# Patient Record
Sex: Female | Born: 1990 | Race: Black or African American | Hispanic: No | Marital: Married | Smoking: Never smoker
Health system: Southern US, Community
[De-identification: ages and names within clinical notes are randomized; demographics above are authoritative.]

## PROBLEM LIST (undated history)

## (undated) DIAGNOSIS — E2839 Other primary ovarian failure: Secondary | ICD-10-CM

## (undated) DIAGNOSIS — N870 Mild cervical dysplasia: Secondary | ICD-10-CM

## (undated) HISTORY — DX: Other primary ovarian failure: E28.39

## (undated) HISTORY — PX: NO PAST SURGERIES: SHX2092

## (undated) HISTORY — DX: Mild cervical dysplasia: N87.0

---

## 2016-04-30 ENCOUNTER — Encounter: Payer: Self-pay | Admitting: Emergency Medicine

## 2016-04-30 ENCOUNTER — Ambulatory Visit
Admission: EM | Admit: 2016-04-30 | Discharge: 2016-04-30 | Disposition: A | Discharge status: Home / Self Care | Payer: Managed Care, Other (non HMO) | Attending: Family Medicine | Admitting: Family Medicine

## 2016-04-30 DIAGNOSIS — Z3202 Encounter for pregnancy test, result negative: Secondary | ICD-10-CM | POA: Diagnosis not present

## 2016-04-30 LAB — HCG, QUANTITATIVE, PREGNANCY: hCG, Beta Chain, Quant, S: 2 m[IU]/mL (ref <5)

## 2016-04-30 LAB — PREGNANCY, URINE: PREG TEST UR: NEGATIVE

## 2016-04-30 NOTE — ED Triage Notes (Signed)
Patient states that she has missed her period by two weeks.  Patient is sexually active and does not use protection. Patient reports some nausea.

## 2016-04-30 NOTE — ED Provider Notes (Signed)
[facility]    CSN: [id number] Arrival date & time: 04/30/16  4098  First Provider Contact:  None       History   Chief Complaint Chief Complaint  Patient presents with  . Possible Pregnancy    HPI Candid Bovey is a 25 y.o. female.   Patient reports not having this pain for at least 2 weeks and she is of course worried that she might be pregnant. No past medical problems. No previous surgeries or operations. No chronic medical problems no pertinent family medical history relevant to today's visit no known drug allergies. She does not smoke. She uses condoms for birth control she is sexually active but admits that she does not use the condoms 100% time. No known drug allergies. She is gravida 0 para 0 AB .   The history is provided by the patient. No language interpreter was used.  Possible Pregnancy  This is a new problem. The current episode started more than 1 week ago. The problem occurs constantly. The problem has not changed since onset.Pertinent negatives include no chest pain, no abdominal pain, no headaches and no shortness of breath. Nothing aggravates the symptoms. Nothing relieves the symptoms. She has tried nothing for the symptoms. The treatment provided no relief.    History reviewed. No pertinent past medical history.  There are no active problems to display for this patient.   History reviewed. No pertinent surgical history.  OB History    No data available       Home Medications    Prior to Admission medications   Not on File    Family History History reviewed. No pertinent family history.  Social History Social History  Substance Use Topics  . Smoking status: Never Smoker  . Smokeless tobacco: Never Used  . Alcohol use Yes     Allergies   Review of patient's allergies indicates no known allergies.   Review of Systems Review of Systems  Respiratory: Negative for shortness of breath.   Cardiovascular: Negative for chest  pain.  Gastrointestinal: Negative for abdominal pain.  Genitourinary: Positive for menstrual problem.  Neurological: Negative for headaches.  All other systems reviewed and are negative.    Physical Exam Triage Vital Signs ED Triage Vitals  Enc Vitals Group     BP 04/30/16 1902 114/76     Pulse Rate 04/30/16 1902 69     Resp 04/30/16 1902 16     Temp 04/30/16 1902 98 F (36.7 C)     Temp Source 04/30/16 1902 Tympanic     SpO2 04/30/16 1902 100 %     Weight 04/30/16 1903 114 lb (51.7 kg)     Height 04/30/16 1903 5\' 3"  (1.6 m)     Head Circumference --      Peak Flow --      Pain Score 04/30/16 1905 0     Pain Loc --      Pain Edu? --      Excl. in GC? --    No data found.   Updated Vital Signs BP 114/76 (BP Location: Right Arm)   Pulse 69   Temp 98 F (36.7 C) (Tympanic)   Resp 16   Ht 5\' 3"  (1.6 m)   Wt 114 lb (51.7 kg)   LMP 03/18/2016 (Approximate)   SpO2 100%   BMI 20.19 kg/m   Visual Acuity Right Eye Distance:   Left Eye Distance:   Bilateral Distance:    Right Eye Near:  Left Eye Near:    Bilateral Near:     Physical Exam  Constitutional: She is oriented to person, place, and time. She appears well-developed and well-nourished.  HENT:  Head: Normocephalic and atraumatic.  Eyes: Pupils are equal, round, and reactive to light.  Neck: Normal range of motion.  Pulmonary/Chest: Effort normal.  Musculoskeletal: Normal range of motion.  Neurological: She is alert and oriented to person, place, and time.  Skin: Skin is warm and dry.  Psychiatric: She has a normal mood and affect.  Vitals reviewed.    UC Treatments / Results  Labs (all labs ordered are listed, but only abnormal results are displayed) Labs Reviewed  PREGNANCY, URINE  HCG, QUANTITATIVE, PREGNANCY    EKG  EKG Interpretation None       Radiology No results found.  Procedures Procedures (including critical care time)  Medications Ordered in UC Medications - No data to  display   Initial Impression / Assessment and Plan / UC Course  I have reviewed the triage vital signs and the nursing notes.  Pertinent labs & imaging results that were available during my care of the patient were reviewed by me and considered in my medical decision making (see chart for details).  Results for orders placed or performed during the hospital encounter of 04/30/16  Pregnancy, urine  Result Value Ref Range   Preg Test, Ur NEGATIVE NEGATIVE  hCG, quantitative, pregnancy  Result Value Ref Range   hCG, Beta Chain, Quant, S 2 <5 mIU/mL    Clinical Course     Patient informed things test was negative. Strongly suggest she use birth control are more effective and regular basis. Will give a work note for [employer].   Final Clinical Impressions(s) / UC Diagnoses     Final diagnoses:  Encounter for pregnancy test with result negative    New Prescriptions   Hassan RowanEugene Ilah Boule, MD 04/30/16 2104

## 2016-05-03 ENCOUNTER — Emergency Department: Payer: Managed Care, Other (non HMO)

## 2016-05-03 ENCOUNTER — Emergency Department
Admission: EM | Admit: 2016-05-03 | Discharge: 2016-05-03 | Disposition: A | Discharge status: Home / Self Care | Payer: Managed Care, Other (non HMO) | Attending: Student in an Organized Health Care Education/Training Program | Admitting: Student in an Organized Health Care Education/Training Program

## 2016-05-03 ENCOUNTER — Encounter: Payer: Self-pay | Admitting: *Deleted

## 2016-05-03 DIAGNOSIS — R112 Nausea with vomiting, unspecified: Secondary | ICD-10-CM | POA: Insufficient documentation

## 2016-05-03 DIAGNOSIS — R109 Unspecified abdominal pain: Secondary | ICD-10-CM

## 2016-05-03 DIAGNOSIS — Z5181 Encounter for therapeutic drug level monitoring: Secondary | ICD-10-CM | POA: Insufficient documentation

## 2016-05-03 DIAGNOSIS — R1032 Left lower quadrant pain: Secondary | ICD-10-CM | POA: Diagnosis not present

## 2016-05-03 DIAGNOSIS — R45851 Suicidal ideations: Secondary | ICD-10-CM | POA: Diagnosis not present

## 2016-05-03 DIAGNOSIS — R1084 Generalized abdominal pain: Secondary | ICD-10-CM | POA: Diagnosis present

## 2016-05-03 LAB — CBC
HEMATOCRIT: 43.3 % (ref 35.0–47.0)
HEMOGLOBIN: 14.9 g/dL (ref 12.0–16.0)
MCH: 30.4 pg (ref 26.0–34.0)
MCHC: 34.4 g/dL (ref 32.0–36.0)
MCV: 88.4 fL (ref 80.0–100.0)
Platelets: 339 10*3/uL (ref 150–440)
RBC: 4.89 MIL/uL (ref 3.80–5.20)
RDW: 12.5 % (ref 11.5–14.5)
WBC: 7.1 10*3/uL (ref 3.6–11.0)

## 2016-05-03 LAB — CHLAMYDIA/NGC RT PCR (ARMC ONLY)
CHLAMYDIA TR: NOT DETECTED
N GONORRHOEAE: NOT DETECTED

## 2016-05-03 LAB — URINALYSIS COMPLETE WITH MICROSCOPIC (ARMC ONLY)
Bacteria, UA: NONE SEEN
Bilirubin Urine: NEGATIVE
GLUCOSE, UA: NEGATIVE mg/dL
HGB URINE DIPSTICK: NEGATIVE
Ketones, ur: NEGATIVE mg/dL
LEUKOCYTES UA: NEGATIVE
Nitrite: NEGATIVE
Protein, ur: NEGATIVE mg/dL
SPECIFIC GRAVITY, URINE: 1.018 (ref 1.005–1.030)
pH: 7 (ref 5.0–8.0)

## 2016-05-03 LAB — COMPREHENSIVE METABOLIC PANEL
ALT: 17 U/L (ref 14–54)
ANION GAP: 14 (ref 5–15)
AST: 26 U/L (ref 15–41)
Albumin: 5.3 g/dL — ABNORMAL HIGH (ref 3.5–5.0)
Alkaline Phosphatase: 44 U/L (ref 38–126)
BILIRUBIN TOTAL: 0.6 mg/dL (ref 0.3–1.2)
BUN: 11 mg/dL (ref 6–20)
CO2: 21 mmol/L — ABNORMAL LOW (ref 22–32)
Calcium: 10.2 mg/dL (ref 8.9–10.3)
Chloride: 106 mmol/L (ref 101–111)
Creatinine, Ser: 0.55 mg/dL (ref 0.44–1.00)
GFR calc Af Amer: >60 mL/min (ref >60)
Glucose, Bld: 87 mg/dL (ref 65–99)
POTASSIUM: 3.9 mmol/L (ref 3.5–5.1)
Sodium: 141 mmol/L (ref 135–145)
TOTAL PROTEIN: 8.6 g/dL — AB (ref 6.5–8.1)

## 2016-05-03 LAB — URINE DRUG SCREEN, QUALITATIVE (ARMC ONLY)
AMPHETAMINES, UR SCREEN: NOT DETECTED
BENZODIAZEPINE, UR SCRN: NOT DETECTED
Barbiturates, Ur Screen: NOT DETECTED
COCAINE METABOLITE, UR ~~LOC~~: NOT DETECTED
Cannabinoid 50 Ng, Ur ~~LOC~~: NOT DETECTED
MDMA (ECSTASY) UR SCREEN: NOT DETECTED
METHADONE SCREEN, URINE: NOT DETECTED
OPIATE, UR SCREEN: NOT DETECTED
Phencyclidine (PCP) Ur S: NOT DETECTED
Tricyclic, Ur Screen: NOT DETECTED

## 2016-05-03 LAB — WET PREP, GENITAL
Clue Cells Wet Prep HPF POC: NONE SEEN
SPERM: NONE SEEN
Trich, Wet Prep: NONE SEEN
Yeast Wet Prep HPF POC: NONE SEEN

## 2016-05-03 LAB — POCT PREGNANCY, URINE: PREG TEST UR: NEGATIVE

## 2016-05-03 MED ORDER — PROMETHAZINE HCL 25 MG PO TABS
12.5000 mg | ORAL_TABLET | Freq: Once | ORAL | Status: AC
Start: 2016-05-03 — End: 2016-05-03
  Administered 2016-05-03: 12.5 mg via ORAL
  Filled 2016-05-03: qty 1

## 2016-05-03 MED ORDER — PROMETHAZINE HCL 12.5 MG PO TABS: 12.5000 mg | ORAL_TABLET | Freq: Four times a day (QID) | ORAL | 0 refills | Status: DC | PRN

## 2016-05-03 MED ORDER — OXYCODONE HCL 5 MG PO TABS
5.0000 mg | ORAL_TABLET | Freq: Once | ORAL | Status: AC
  Administered 2016-05-03: 5 mg via ORAL
  Filled 2016-05-03: qty 1

## 2016-05-03 MED ORDER — DICYCLOMINE HCL 10 MG PO CAPS
10.0000 mg | ORAL_CAPSULE | Freq: Once | ORAL | Status: AC
  Administered 2016-05-03: 10 mg via ORAL
  Filled 2016-05-03: qty 1

## 2016-05-03 MED ORDER — DICYCLOMINE HCL 10 MG PO CAPS: 10.0000 mg | ORAL_CAPSULE | Freq: Three times a day (TID) | ORAL | 0 refills | Status: DC

## 2016-05-03 NOTE — ED Notes (Signed)
Pt in ultrasound vis stretcher

## 2016-05-03 NOTE — ED Notes (Signed)
Patient states she doesn't want to be seen for suicidal ideation and plan, but just for her abdominal pain. Patient states she misheard  the 3 times this Clinical research associatewriter asked her if she had a plan and when it affirmed that she did if she could detail the plan which she declined to do. Patient did agree to be seen and dress out for the behavioral quad.

## 2016-05-03 NOTE — ED Notes (Addendum)
Pt states abdominal pain, N&V, migraines x few weeks. Pt states last emesis was this AM. Pt states she took a urine and blood pregnancy test that were both negative. Pt states LMP July 17th. Pt states abdominal pain central lower.

## 2016-05-03 NOTE — ED Provider Notes (Signed)
[facility] Emergency Department Provider Note    First MD Initiated Contact with Patient 05/03/16 1516     (approximate)  I have reviewed the triage vital signs and the nursing notes.   HISTORY  Chief Complaint Abdominal Pain and Suicidal    HPI Kim Gilbert is a 25 y.o. female presents with 2 weeks of diffuse abdominal pain. Patient states that she's had several days of nausea and vomiting. Denies any fevers. No recent shortness of breath. Denies any burning with urination or flank pain. Has not had any previous abdominal surgeries. [facility] and was [facility] here for further evaluation.States her last cycle was 4 weeks ago. She is sexually active and does not use barrier protection. She's not on any oral contraceptive pills.  Of note during the triage process the patient stated that she had been having passive suicidal ideation due to not being able to find a job after graduating college. On questioning in the ER the patient denies any active suicidal ideation. She does not have a plan. Is not currently on any psychiatric medications. States that she will have passive suicidal ideation when she gets stressed out because she can't find a job. States that she does have good support at home. Does not have any thoughts of harming herself or others at this time. No auditory or visual hallucinations.   History reviewed. No pertinent past medical history.  There are no active problems to display for this patient.   History reviewed. No pertinent surgical history.  Prior to Admission medications   Not on File    Allergies Review of patient's allergies indicates no known allergies.  No family history on file.  Social History Social History  Substance Use Topics  . Smoking status: Never Smoker  . Smokeless tobacco: Never Used  . Alcohol use Yes     Comment: ocasionally    Review of Systems Patient denies headaches, rhinorrhea,  blurry vision, numbness, shortness of breath, chest pain, edema, cough, abdominal pain, nausea, vomiting, diarrhea, dysuria, fevers, rashes or hallucinations unless otherwise stated above in HPI. ____________________________________________   PHYSICAL EXAM:  VITAL SIGNS: Vitals:   05/03/16 1409  BP: 115/80  Pulse: 98  Resp: 18  Temp: 98.3 F (36.8 C)    Constitutional: Alert and oriented. Well appearing and in no acute distress. Eyes: Conjunctivae are normal. PERRL. EOMI. Head: Atraumatic. Nose: No congestion/rhinnorhea. Mouth/Throat: Mucous membranes are moist.  Oropharynx non-erythematous. Neck: No stridor. Painless ROM. No cervical spine tenderness to palpation Hematological/Lymphatic/Immunilogical: No cervical lymphadenopathy. Cardiovascular: Normal rate, regular rhythm. Grossly normal heart sounds.  Good peripheral circulation. Respiratory: Normal respiratory effort.  No retractions. Lungs CTAB. Gastrointestinal: Soft and non-tender, mild llq ttp, no rebound or guarding. No distention. No abdominal bruits. No CVA tenderness. Genitourinary: Normal appearing vaginal mucosa, no CMT, mild ttp in left adnexa Musculoskeletal: No lower extremity tenderness nor edema.  No joint effusions. Neurologic:  Normal speech and language. No gross focal neurologic deficits are appreciated. No gait instability. Skin:  Skin is warm, dry and intact. No rash noted. Psychiatric: Mood and affect are normal. Speech and behavior are normal.  ____________________________________________   LABS (all labs ordered are listed, but only abnormal results are displayed)  Results for orders placed or performed during the hospital encounter of 05/03/16 (from the past 24 hour(s))  cbc     Status: None   Collection Time: 05/03/16  2:25 PM  Result Value Ref Range   WBC 7.1 3.6 -  11.0 K/uL   RBC 4.89 3.80 - 5.20 MIL/uL   Hemoglobin 14.9 12.0 - 16.0 g/dL   HCT 16.1 09.6 - 04.5 %   MCV 88.4 80.0 - 100.0 fL     MCH 30.4 26.0 - 34.0 pg   MCHC 34.4 32.0 - 36.0 g/dL   RDW 40.9 81.1 - 91.4 %   Platelets 339 150 - 440 K/uL  Urine Drug Screen, Qualitative     Status: None   Collection Time: 05/03/16  2:25 PM  Result Value Ref Range   Tricyclic, Ur Screen NONE DETECTED NONE DETECTED   Amphetamines, Ur Screen NONE DETECTED NONE DETECTED   MDMA (Ecstasy)Ur Screen NONE DETECTED NONE DETECTED   Cocaine Metabolite,Ur [place] NONE DETECTED NONE DETECTED   Opiate, Ur Screen NONE DETECTED NONE DETECTED   Phencyclidine (PCP) Ur S NONE DETECTED NONE DETECTED   Cannabinoid 50 Ng, Ur [place] NONE DETECTED NONE DETECTED   Barbiturates, Ur Screen NONE DETECTED NONE DETECTED   Benzodiazepine, Ur Scrn NONE DETECTED NONE DETECTED   Methadone Scn, Ur NONE DETECTED NONE DETECTED  Urinalysis complete, with microscopic ([facility] only)     Status: Abnormal   Collection Time: 05/03/16  2:25 PM  Result Value Ref Range   Color, Urine YELLOW (A) YELLOW   APPearance CLOUDY (A) CLEAR   Glucose, UA NEGATIVE NEGATIVE mg/dL   Bilirubin Urine NEGATIVE NEGATIVE   Ketones, ur NEGATIVE NEGATIVE mg/dL   Specific Gravity, Urine 1.018 1.005 - 1.030   Hgb urine dipstick NEGATIVE NEGATIVE   pH 7.0 5.0 - 8.0   Protein, ur NEGATIVE NEGATIVE mg/dL   Nitrite NEGATIVE NEGATIVE   Leukocytes, UA NEGATIVE NEGATIVE   RBC / HPF 0-5 0 - 5 RBC/hpf   WBC, UA 0-5 0 - 5 WBC/hpf   Bacteria, UA NONE SEEN NONE SEEN   Squamous Epithelial / LPF 0-5 (A) NONE SEEN   Mucous PRESENT    Amorphous [place] PRESENT   Pregnancy, urine POC     Status: None   Collection Time: 05/03/16  2:31 PM  Result Value Ref Range   Preg Test, Ur NEGATIVE NEGATIVE   ____________________________________________  ____________________________________________  RADIOLOGY  TVUS IMPRESSION: Normal pelvic ultrasound. ____________________________________________   PROCEDURES  Procedure(s) performed: none    Critical Care performed:  no ____________________________________________   INITIAL IMPRESSION / ASSESSMENT AND PLAN / ED COURSE  Pertinent labs & imaging results that were available during my care of the patient were reviewed by me and considered in my medical decision making (see chart for details).  DDX: UTI, ectopic, pregnancy, torsion, TOA, PID, appendicitis, gastritis  Kim Gilbert is a 25 y.o. who presents to the ED with 2 weeks of lower abdominal pain and 1 day of nausea and vomiting. Patient is concerned that she is pregnant. Patient arrives afebrile and hemodynamically stable. No guarding or rebound on exam. There was report of her being passively suicidal in triage therefore she was initially brought over to psychiatric holding but on my evaluation she denies any suicidal ideation and appears stable from that standpoint. At this point we'll proceed with further evaluation of her abdominal pain. She is afebrile and the duration of symptoms have been ongoing for 2 weeks have a lower suspicion for acute appendicitis. Do not feel CT imaging clinically indicated at this time. The patient will be placed on continuous pulse oximetry and telemetry for monitoring.  Laboratory evaluation will be sent to evaluate for the above complaints.     Clinical Course  Comment By  Time  Urine pregnancy is negative. Willy EddyPatrick Anthem Frazer, MD 09/01 1551  Pelvic exam with mild tenderness to palpation in the left adnexa. No masses no guarding. Based on presentation will order transvaginal ultrasound to assess for cyst versus abscess. Willy EddyPatrick Jerlene Rockers, MD 09/01 1603  Repeat abdominal abdominal exam is soft and benign. Ultrasound shows no evidence of torsion or cyst. Presentation is not consistent with PID. Based on her well-appearing presentation do feel that she is appropriate for symptomatically management and have instructed patient to return to the ER for repeat abdominal exam in 12 hours if symptoms not improved.  Patient was able to  tolerate PO and was able to ambulate with a steady gait.  Willy EddyPatrick Serai Tukes, MD 09/01 1807   Have discussed with the patient and available family all diagnostics and treatments performed thus far and all questions were answered to the best of my ability. The patient demonstrates understanding and agreement with plan.   ____________________________________________   FINAL CLINICAL IMPRESSION(S) / ED DIAGNOSES  Final diagnoses:  Abdominal pain  Left lower quadrant pain      NEW MEDICATIONS STARTED DURING THIS VISIT:  New Prescriptions   No medications on file     Note:  This document was prepared using Dragon voice recognition software and may include unintentional dictation errors.    Willy EddyPatrick Nakema Fake, MD 05/03/16 [id number]

## 2016-05-03 NOTE — ED Notes (Signed)
Pt returned from U/S via stretcher. 

## 2016-05-03 NOTE — ED Triage Notes (Addendum)
Patient c/o sharp lower abdominal pain that has been present for two weeks. Patient c/o nausea and vomiting x5 today. [facility] and advised to come here. Patient was seen at a clinic 3 days ago and a HCG blood and urine pregnancy tests were done which came back negative. Patient states her last period was 7/17 and last for two days.

## 2016-05-03 NOTE — ED Triage Notes (Signed)
At the end of the triage assessment, patient state she has suicidal ideation and a plan.

## 2016-05-23 DIAGNOSIS — E2839 Other primary ovarian failure: Secondary | ICD-10-CM | POA: Insufficient documentation

## 2017-01-28 ENCOUNTER — Ambulatory Visit
Admission: EM | Admit: 2017-01-28 | Discharge: 2017-01-28 | Disposition: A | Discharge status: Home / Self Care | Payer: Managed Care, Other (non HMO) | Attending: Family Medicine | Admitting: Family Medicine

## 2017-01-28 ENCOUNTER — Encounter: Payer: Self-pay | Admitting: Gynecology

## 2017-01-28 DIAGNOSIS — N390 Urinary tract infection, site not specified: Secondary | ICD-10-CM

## 2017-01-28 LAB — URINALYSIS, COMPLETE (UACMP) WITH MICROSCOPIC
BILIRUBIN URINE: NEGATIVE
Glucose, UA: NEGATIVE mg/dL
Ketones, ur: NEGATIVE mg/dL
NITRITE: POSITIVE — AB
PROTEIN: 100 mg/dL — AB
Specific Gravity, Urine: 1.025 (ref 1.005–1.030)
pH: 7 (ref 5.0–8.0)

## 2017-01-28 MED ORDER — CEPHALEXIN 500 MG PO CAPS: 500.0000 mg | ORAL_CAPSULE | Freq: Two times a day (BID) | ORAL | 0 refills | Status: DC

## 2017-01-28 MED ORDER — PHENAZOPYRIDINE HCL 200 MG PO TABS: 200.0000 mg | ORAL_TABLET | Freq: Three times a day (TID) | ORAL | 0 refills | Status: DC

## 2017-01-28 NOTE — ED Triage Notes (Signed)
Patient c/o painful urination x today.

## 2017-01-28 NOTE — ED Provider Notes (Signed)
CSN: [id number]     Arrival date & time 01/28/17  1704 History   None    Chief Complaint  Patient presents with  . Urinary Tract Infection   (Consider location/radiation/quality/duration/timing/severity/associated sxs/prior Treatment) HPI  This a 26 year old female who presents with painful urination for 1 day. Urinary tract infections in the past and this is very similar. He complains of dysuria frequency urgency. She has no vaginal discharge. She has no nausea vomiting and has had no fever.       History reviewed. No pertinent past medical history. History reviewed. No pertinent surgical history. No family history on file. Social History  Substance Use Topics  . Smoking status: Never Smoker  . Smokeless tobacco: Never Used  . Alcohol use Yes     Comment: ocasionally   OB History    Gravida Para Term Preterm AB Living   0 0 0 0 0 0   SAB TAB Ectopic Multiple Live Births   0 0 0 0 0     Review of Systems  Constitutional: Negative for activity change, appetite change, chills, fatigue and fever.  Genitourinary: Positive for dysuria, frequency and urgency. Negative for vaginal discharge and vaginal pain.  All other systems reviewed and are negative.   Allergies  Patient has no known allergies.  Home Medications   Prior to Admission medications   Medication Sig Start Date End Date Taking? Authorizing Provider  estrogens, conjugated, (PREMARIN) 0.3 MG tablet Take 0.3 mg by mouth daily. Take daily for 21 days then do not take for 7 days.   Yes [provider]  progesterone (PROMETRIUM) 100 MG capsule Take 100 mg by mouth daily.   Yes [provider]  cephALEXin (KEFLEX) 500 MG capsule Take 1 capsule (500 mg total) by mouth 2 (two) times daily. 01/28/17   Lutricia Feiloemer, Ameli Sangiovanni P, PA-C  dicyclomine (BENTYL) 10 MG capsule Take 1 capsule (10 mg total) by mouth 4 (four) times daily -  before meals and at bedtime. 05/03/16 05/07/16  Willy Eddyobinson, Patrick, MD  phenazopyridine  (PYRIDIUM) 200 MG tablet Take 1 tablet (200 mg total) by mouth 3 (three) times daily. 01/28/17   Lutricia Feiloemer, Deklynn Charlet P, PA-C  promethazine (PHENERGAN) 12.5 MG tablet Take 1 tablet (12.5 mg total) by mouth every 6 (six) hours as needed for nausea or vomiting. 05/03/16   Willy Eddyobinson, Patrick, MD   Meds Ordered and Administered this Visit  Medications - No data to display  BP 110/80 (BP Location: Left Arm)   Pulse 76   Temp 98.2 F (36.8 C) (Oral)   Resp 16   Ht 5\' 2"  (1.575 m)   Wt 114 lb (51.7 kg)   LMP 01/21/2017   SpO2 100%   BMI 20.85 kg/m  No data found.   Physical Exam  Constitutional: She is oriented to person, place, and time. She appears well-developed and well-nourished. No distress.  HENT:  Head: Normocephalic.  Eyes: Pupils are equal, round, and reactive to light.  Neck: Normal range of motion.  Pulmonary/Chest: Effort normal and breath sounds normal. No respiratory distress. She has no wheezes. She has no rales.  Abdominal: Soft. Bowel sounds are normal. She exhibits no distension and no mass. There is tenderness. There is no rebound and no guarding.  She has mild right lower quadrant discomfort.  Musculoskeletal: Normal range of motion.  Neurological: She is alert and oriented to person, place, and time.  Skin: Skin is warm and dry.  Psychiatric: She has a normal mood and affect.  Her behavior is normal. Judgment and thought content normal.  Nursing note and vitals reviewed.   Urgent Care Course     Procedures (including critical care time)  Labs Review Labs Reviewed  URINALYSIS, COMPLETE (UACMP) WITH MICROSCOPIC - Abnormal; Notable for the following:       Result Value   APPearance CLOUDY (*)    Hgb urine dipstick LARGE (*)    Protein, ur 100 (*)    Nitrite POSITIVE (*)    Leukocytes, UA SMALL (*)    Squamous Epithelial / LPF 0-5 (*)    Bacteria, UA MANY (*)    All other components within normal limits  URINE CULTURE    Imaging Review No results  found.   Visual Acuity Review  Right Eye Distance:   Left Eye Distance:   Bilateral Distance:    Right Eye Near:   Left Eye Near:    Bilateral Near:         MDM   1. Lower urinary tract infectious disease    Discharge Medication List as of 01/28/2017  8:12 PM    START taking these medications   Details  cephALEXin (KEFLEX) 500 MG capsule Take 1 capsule (500 mg total) by mouth 2 (two) times daily., Starting Tue 01/28/2017, Normal    phenazopyridine (PYRIDIUM) 200 MG tablet Take 1 tablet (200 mg total) by mouth 3 (three) times daily., Starting Tue 01/28/2017, Normal      Plan: 1. Test/x-ray results and diagnosis reviewed with patient 2. rx as per orders; risks, benefits, potential side effects reviewed with patient 3. Recommend supportive treatment with Increase fluids. Send him regarding Pyridium turning her urine orange. Cultures and sensitivities of the urine will be available in 48 hours. 4. F/u prn if symptoms worsen or don't improve     Lutricia Feil, PA-C 01/28/17 2034

## 2017-01-31 LAB — URINE CULTURE: Culture: >=100000 — AB

## 2017-06-10 DIAGNOSIS — K625 Hemorrhage of anus and rectum: Secondary | ICD-10-CM | POA: Insufficient documentation

## 2017-07-11 ENCOUNTER — Ambulatory Visit
Admission: EM | Admit: 2017-07-11 | Discharge: 2017-07-11 | Disposition: A | Discharge status: Home / Self Care | Payer: Managed Care, Other (non HMO) | Attending: Family Medicine | Admitting: Family Medicine

## 2017-07-11 ENCOUNTER — Encounter: Payer: Self-pay | Admitting: *Deleted

## 2017-07-11 DIAGNOSIS — L259 Unspecified contact dermatitis, unspecified cause: Secondary | ICD-10-CM | POA: Diagnosis not present

## 2017-07-11 DIAGNOSIS — R21 Rash and other nonspecific skin eruption: Secondary | ICD-10-CM | POA: Diagnosis not present

## 2017-07-11 MED ORDER — TRIAMCINOLONE ACETONIDE 0.1 % EX OINT: 1.0000 "application " | TOPICAL_OINTMENT | Freq: Two times a day (BID) | CUTANEOUS | 0 refills | Status: DC

## 2017-07-11 NOTE — ED Triage Notes (Signed)
Rash to left neck x3 days.

## 2017-07-11 NOTE — ED Provider Notes (Signed)
[facility]    CSN: [id number] Arrival date & time: 07/11/17  1529     History   Chief Complaint Chief Complaint  Patient presents with  . Rash   HPI  26 year old female presents with rash.  Patient reports that yesterday she noticed some irritation on the left side of her neck.  She attributed this to her badge from work.  She states that it worsened today and is now extended up the neck towards the chin.  It is itchy.  Dry.  She has been applying Benadryl cream with some improvement.  No known exacerbating factors.  Mild in severity.  No other associated symptoms.  No other complaints at this time.   Social History Social History   Tobacco Use  . Smoking status: Never Smoker  . Smokeless tobacco: Never Used  Substance Use Topics  . Alcohol use: Yes    Comment: ocasionally  . Drug use: No    Allergies   Patient has no known allergies.   Review of Systems Review of Systems  Constitutional: Negative.   Skin: Positive for rash.       Rash and associated itching. Dryness.   Physical Exam Triage Vital Signs ED Triage Vitals  Enc Vitals Group     BP 07/11/17 1544 112/72     Pulse Rate 07/11/17 1544 72     Resp 07/11/17 1544 16     Temp 07/11/17 1544 98.5 F (36.9 C)     Temp Source 07/11/17 1544 Oral     SpO2 07/11/17 1544 100 %     Weight 07/11/17 1546 120 lb (54.4 kg)     Height 07/11/17 1546 5\' 3"  (1.6 m)     Head Circumference --      Peak Flow --      Pain Score --      Pain Loc --      Pain Edu? --      Excl. in GC? --    No data found.  Updated Vital Signs BP 112/72 (BP Location: Left Arm)   Pulse 72   Temp 98.5 F (36.9 C) (Oral)   Resp 16   Ht 5\' 3"  (1.6 m)   Wt 120 lb (54.4 kg)   LMP 06/26/2017   SpO2 100%   BMI 21.26 kg/m  Physical Exam  Constitutional: She appears well-developed and well-nourished. No distress.  HENT:  Head: Normocephalic and atraumatic.  Nose: Nose normal.  Eyes: Conjunctivae are normal. Right eye  exhibits no discharge. Left eye exhibits no discharge.  Cardiovascular: Normal rate and regular rhythm.  No murmur heard. Pulmonary/Chest: Effort normal and breath sounds normal. No respiratory distress. She has no wheezes. She has no rales.  Neurological: She exhibits normal muscle tone.  Skin:  Left anterior neck with hyperpigmentation and dryness which extends proximally.  Psychiatric: She has a normal mood and affect. Her behavior is normal.  Vitals reviewed.  UC Treatments / Results  Labs (all labs ordered are listed, but only abnormal results are displayed) Labs Reviewed - No data to display  EKG  EKG Interpretation None       Radiology No results found.  Procedures Procedures (including critical care time)  Medications Ordered in UC Medications - No data to display   Initial Impression / Assessment and Plan / UC Course  I have reviewed the triage vital signs and the nursing notes.  Pertinent labs & imaging results that were available during my care of the patient were  reviewed by me and considered in my medical decision making (see chart for details).     26 year old female presents with rash.  Appears to be consistent with contact dermatitis.  Treating with triamcinolone.  Final Clinical Impressions(s) / UC Diagnoses   Final diagnoses:  Contact dermatitis, unspecified contact dermatitis type, unspecified trigger    ED Discharge Orders        Ordered    triamcinolone ointment (KENALOG) 0.1 %  2 times daily     07/11/17 1622     Controlled Substance Prescriptions [place] Controlled Substance Registry consulted? Not Applicable   Tommie SamsCook, Jaslene Marsteller G, DO 07/11/17 [id number]

## 2017-10-08 ENCOUNTER — Other Ambulatory Visit: Payer: Self-pay

## 2017-10-08 ENCOUNTER — Ambulatory Visit
Admission: EM | Admit: 2017-10-08 | Discharge: 2017-10-08 | Disposition: A | Discharge status: Home / Self Care | Payer: Managed Care, Other (non HMO) | Attending: Family Medicine | Admitting: Family Medicine

## 2017-10-08 DIAGNOSIS — J029 Acute pharyngitis, unspecified: Secondary | ICD-10-CM | POA: Diagnosis not present

## 2017-10-08 DIAGNOSIS — J0100 Acute maxillary sinusitis, unspecified: Secondary | ICD-10-CM | POA: Diagnosis not present

## 2017-10-08 LAB — RAPID STREP SCREEN (MED CTR MEBANE ONLY): Streptococcus, Group A Screen (Direct): NEGATIVE

## 2017-10-08 MED ORDER — AMOXICILLIN 875 MG PO TABS: 875.0000 mg | ORAL_TABLET | Freq: Two times a day (BID) | ORAL | 0 refills | Status: DC

## 2017-10-08 NOTE — ED Triage Notes (Signed)
Patient complains of sore throat that started 2 weeks ago, with body aches.

## 2017-10-08 NOTE — ED Provider Notes (Signed)
[facility]    CSN: [id number] Arrival date & time: 10/08/17  1737     History   Chief Complaint Chief Complaint  Patient presents with  . Sore Throat    HPI Kim Gilbert is a 27 y.o. female.   The history is provided by the patient.  URI  Presenting symptoms: congestion, cough, fatigue, rhinorrhea and sore throat   Severity:  Moderate Onset quality:  Sudden Timing:  Constant Progression:  Worsening Chronicity:  New Relieved by:  Nothing Ineffective treatments:  OTC medications Associated symptoms: no headaches, no sinus pain and no wheezing   Risk factors: not elderly, no chronic cardiac disease, no chronic kidney disease, no chronic respiratory disease, no immunosuppression, no recent illness, no recent travel and no sick contacts     History reviewed. No pertinent past medical history.  There are no active problems to display for this patient.   Past Surgical History:  Procedure Laterality Date  . NO PAST SURGERIES      OB History    Gravida Para Term Preterm AB Living   0 0 0 0 0 0   SAB TAB Ectopic Multiple Live Births   0 0 0 0 0       Home Medications    Prior to Admission medications   Medication Sig Start Date End Date Taking? Authorizing Provider  amoxicillin (AMOXIL) 875 MG tablet Take 1 tablet (875 mg total) by mouth 2 (two) times daily. 10/08/17   Payton Mccallum, MD  triamcinolone ointment (KENALOG) 0.1 % Apply 1 application 2 (two) times daily topically. For the next 1-2 weeks. 07/11/17   Tommie Sams, DO    Family History History reviewed. No pertinent family history.  Social History Social History   Tobacco Use  . Smoking status: Never Smoker  . Smokeless tobacco: Never Used  Substance Use Topics  . Alcohol use: Yes    Comment: occasionally  . Drug use: No     Allergies   Patient has no known allergies.   Review of Systems Review of Systems  Constitutional: Positive for fatigue.  HENT: Positive for  congestion, rhinorrhea and sore throat. Negative for sinus pain.   Respiratory: Positive for cough. Negative for wheezing.   Neurological: Negative for headaches.     Physical Exam Triage Vital Signs ED Triage Vitals  Enc Vitals Group     BP 10/08/17 1822 112/62     Pulse Rate 10/08/17 1822 66     Resp 10/08/17 1822 18     Temp 10/08/17 1822 98.8 F (37.1 C)     Temp Source 10/08/17 1822 Oral     SpO2 10/08/17 1822 100 %     Weight 10/08/17 1821 120 lb (54.4 kg)     Height 10/08/17 1821 5\' 3"  (1.6 m)     Head Circumference --      Peak Flow --      Pain Score 10/08/17 1820 3     Pain Loc --      Pain Edu? --      Excl. in [place]? --    No data found.  Updated Vital Signs BP 112/62 (BP Location: Left Arm)   Pulse 66   Temp 98.8 F (37.1 C) (Oral)   Resp 18   Ht 5\' 3"  (1.6 m)   Wt 120 lb (54.4 kg)   LMP 09/24/2017   SpO2 100%   BMI 21.26 kg/m   Visual Acuity Right Eye Distance:   Left Eye  Distance:   Bilateral Distance:    Right Eye Near:   Left Eye Near:    Bilateral Near:     Physical Exam  Constitutional: She appears well-developed and well-nourished. No distress.  HENT:  Head: Normocephalic and atraumatic.  Right Ear: Tympanic membrane, external ear and ear canal normal.  Left Ear: Tympanic membrane, external ear and ear canal normal.  Nose: Mucosal edema and rhinorrhea present. No nose lacerations, sinus tenderness, nasal deformity, septal deviation or nasal septal hematoma. No epistaxis.  No foreign bodies. Right sinus exhibits maxillary sinus tenderness and frontal sinus tenderness. Left sinus exhibits maxillary sinus tenderness and frontal sinus tenderness.  Mouth/Throat: Uvula is midline, oropharynx is clear and moist and mucous membranes are normal. No oropharyngeal exudate.  Eyes: Conjunctivae and EOM are normal. Pupils are equal, round, and reactive to light. Right eye exhibits no discharge. Left eye exhibits no discharge. No scleral icterus.  Neck:  Normal range of motion. Neck supple. No thyromegaly present.  Cardiovascular: Normal rate, regular rhythm and normal heart sounds.  Pulmonary/Chest: Effort normal and breath sounds normal. No respiratory distress. She has no wheezes. She has no rales.  Lymphadenopathy:    She has no cervical adenopathy.  Skin: She is not diaphoretic.  Nursing note and vitals reviewed.    UC Treatments / Results  Labs (all labs ordered are listed, but only abnormal results are displayed) Labs Reviewed  RAPID STREP SCREEN (NOT AT [facility])  CULTURE, GROUP A STREP [facility]    EKG  EKG Interpretation None       Radiology No results found.  Procedures Procedures (including critical care time)  Medications Ordered in UC Medications - No data to display   Initial Impression / Assessment and Plan / UC Course  I have reviewed the triage vital signs and the nursing notes.  Pertinent labs & imaging results that were available during my care of the patient were reviewed by me and considered in my medical decision making (see chart for details).       Final Clinical Impressions(s) / UC Diagnoses   Final diagnoses:  Acute maxillary sinusitis, recurrence not specified    ED Discharge Orders        Ordered    amoxicillin (AMOXIL) 875 MG tablet  2 times daily     10/08/17 1858     1. diagnosis reviewed with patient 2. rx as per orders above; reviewed possible side effects, interactions, risks and benefits  3. Recommend supportive treatment with otc flonase, analgesics prn 4. Follow-up prn if symptoms worsen or don't improve Controlled Substance Prescriptions [place] Controlled Substance Registry consulted? Not Applicable   Payton Mccallumonty, Dariane Natzke, MD 10/08/17 [phone]

## 2017-10-10 ENCOUNTER — Telehealth: Payer: Self-pay | Admitting: Emergency Medicine

## 2017-10-10 LAB — CULTURE, GROUP A STREP (THRC)

## 2017-10-10 NOTE — Telephone Encounter (Signed)
Patient notified of lab result. Patient to continue with her Amoxicillin and follow-up here or with PCP if her symptoms persist or worsen.  Patient verbalized understanding.

## 2018-01-10 IMAGING — US US PELVIS COMPLETE
1 series · 14 of 25 positions shown · non-contrast
Comparison: None

CLINICAL DATA: Abdominal pain, low midline abdominal pain 2 weeks



[Series 1: us pelvis complete · 0.18mm/px · 14 of 53 slices shown]
[im 1/53]
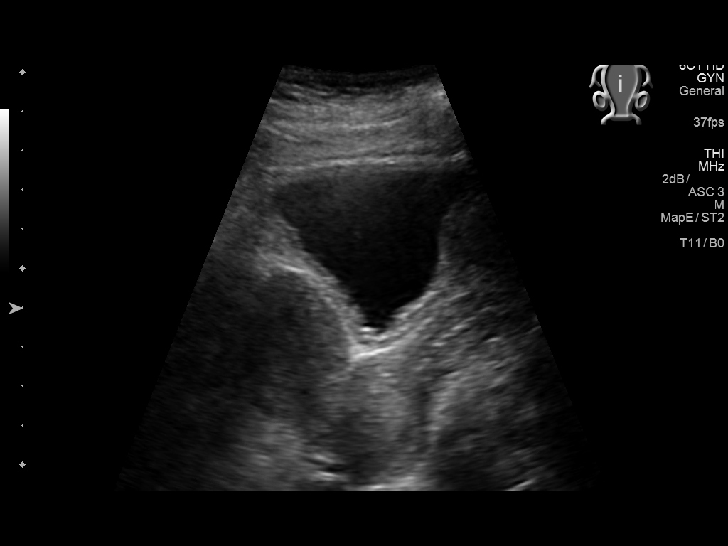
[im 5/53]
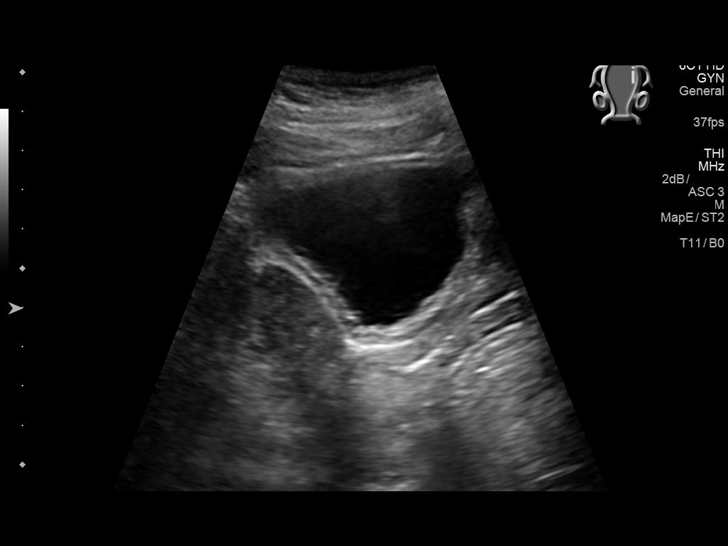
[im 9/53]
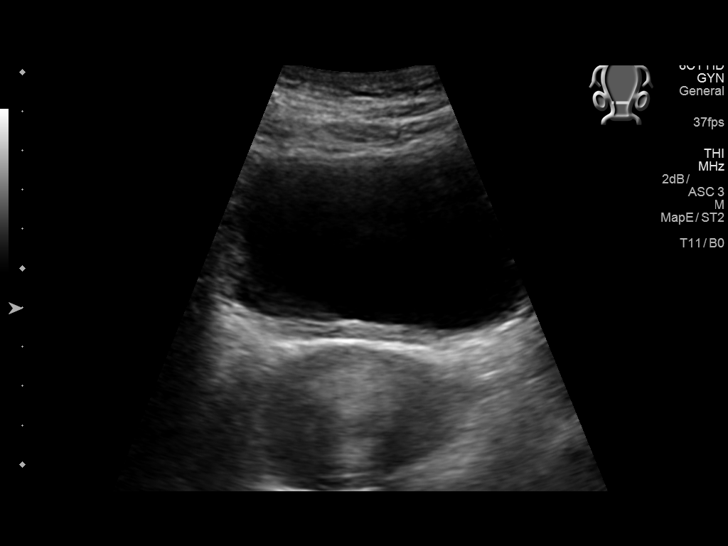
[im 14/53]
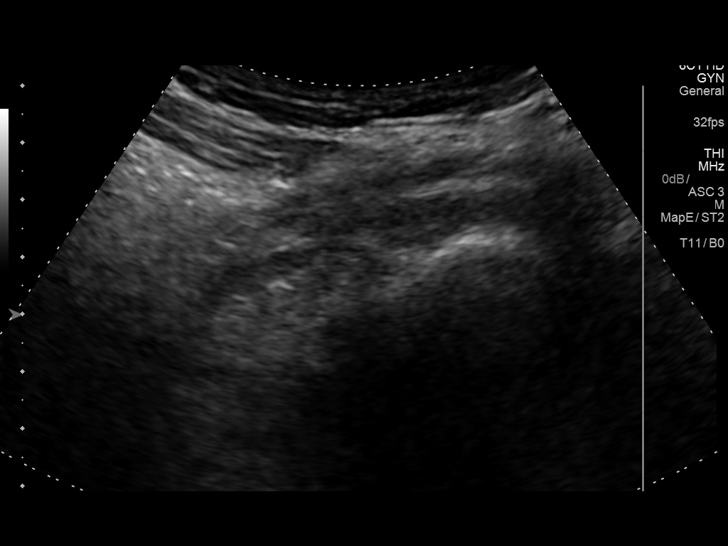
[im 18/53]
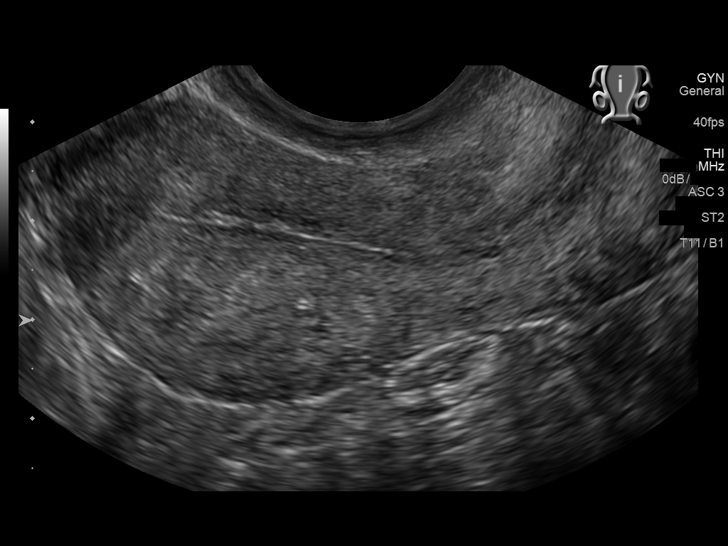
[im 20/53]
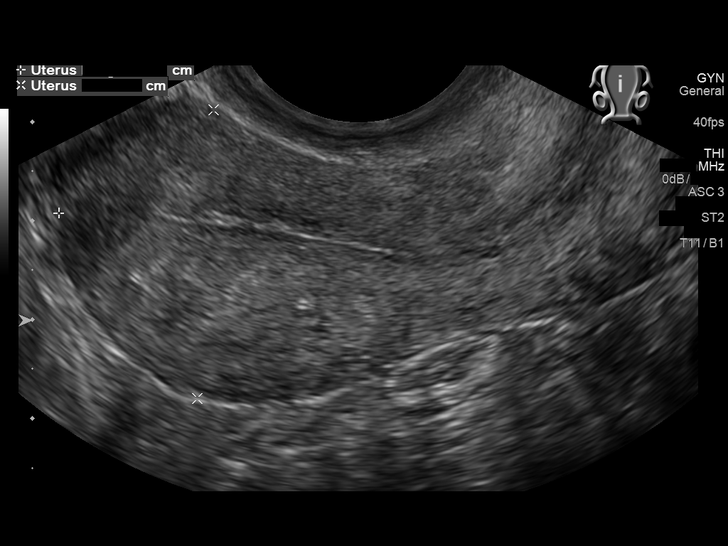
[im 24/53]
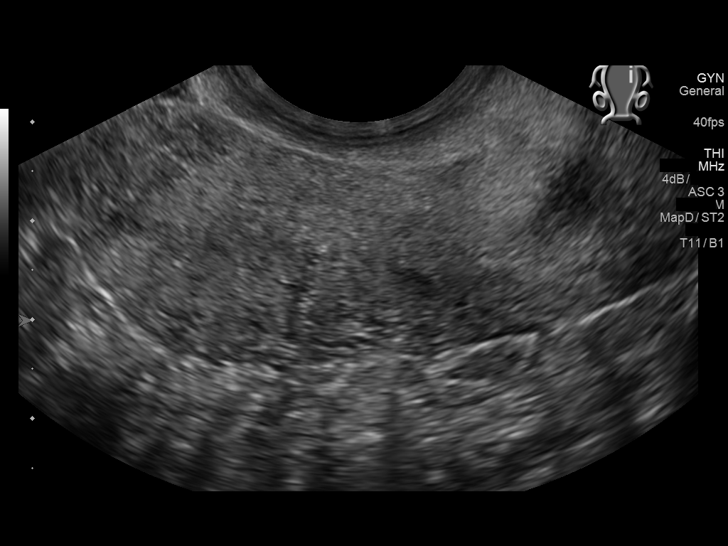
[im 29/53]
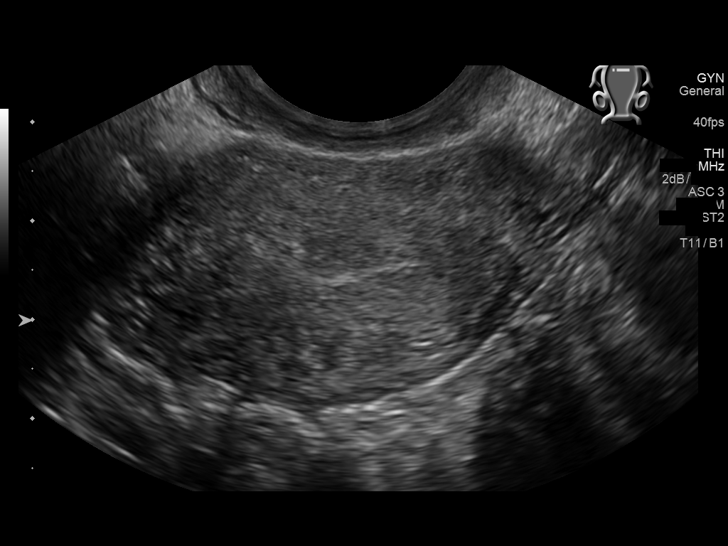
[im 33/53]
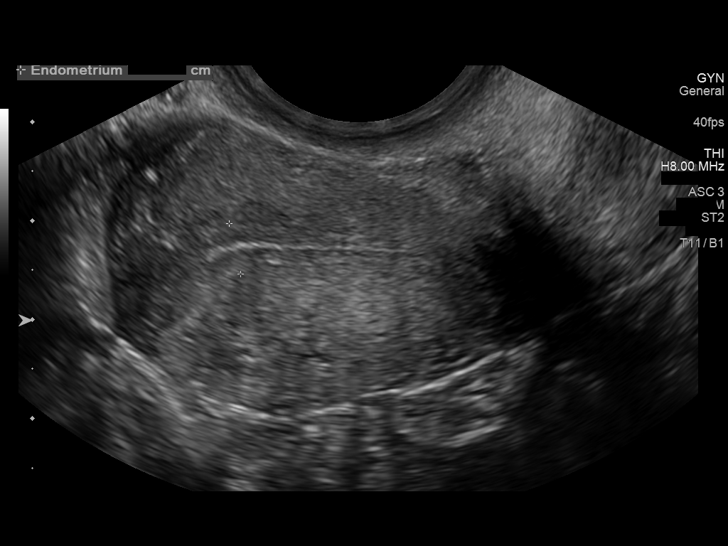
[im 35/53]
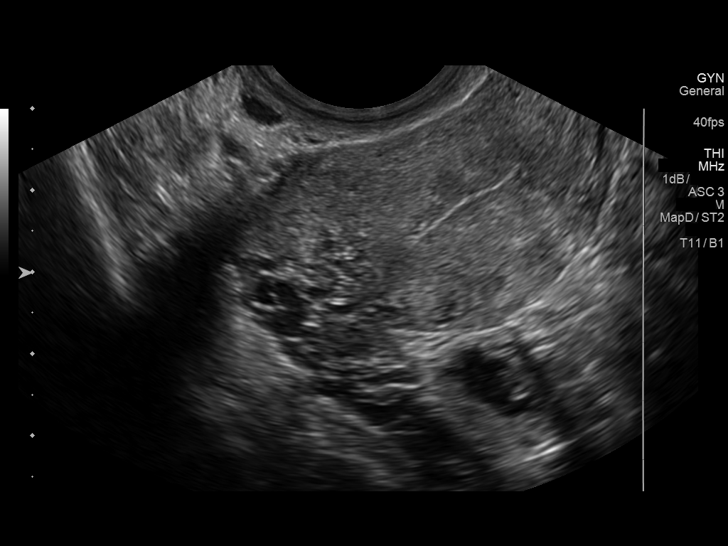
[im 40/53]
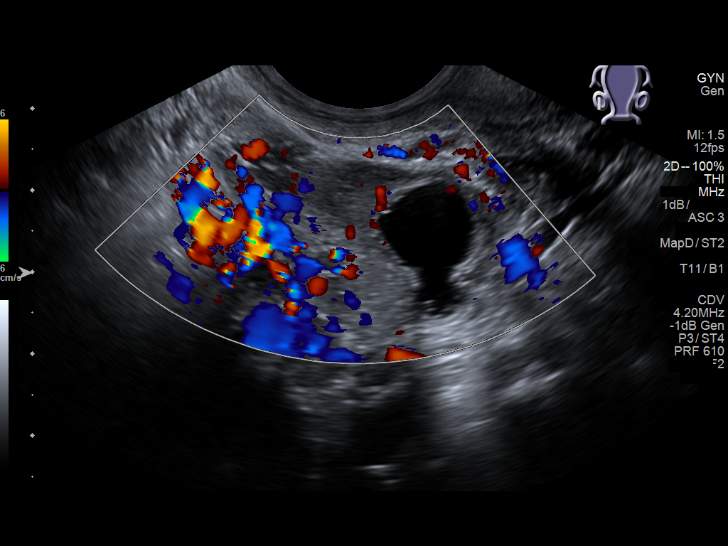
[im 44/53]
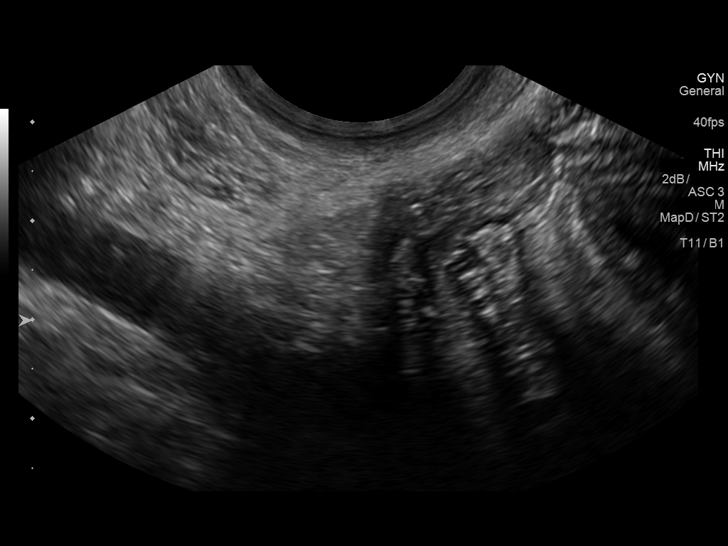
[im 48/53]
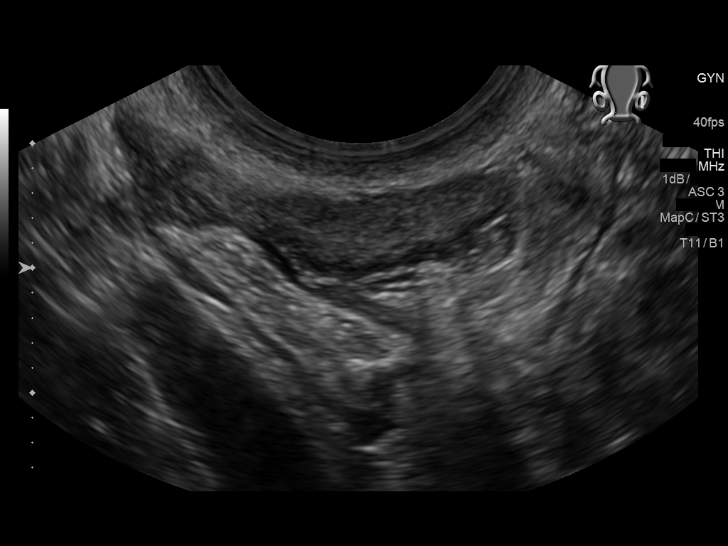
[im 53/53]
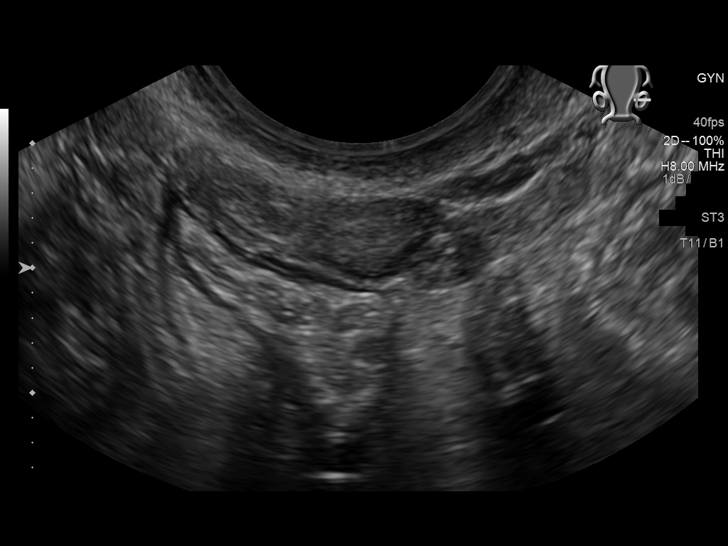

[14 of 25 positions shown; findings below may reference images not displayed]

FINDINGS: Uterus

Measurements: 6.4 x 2.9 x 3.7 cm. No fibroids or other mass
visualized.

Endometrium

Thickness: Normal thickness at 5.2 mm. No focal abnormality
visualized.

Right ovary

Measurements: Normal in size at 3.0 x 1.6 x 2.0 cm. Normal
appearance/no adnexal mass.

Left ovary

Measurements: Normal size a 1.8 x 0.7 x 0.7 cm. Normal appearance/no
adnexal mass.

Other findings

No abnormal free fluid.
IMPRESSION: Normal pelvic ultrasound.

## 2020-10-29 DIAGNOSIS — Z8719 Personal history of other diseases of the digestive system: Secondary | ICD-10-CM | POA: Insufficient documentation

## 2020-10-30 DIAGNOSIS — R1031 Right lower quadrant pain: Secondary | ICD-10-CM | POA: Insufficient documentation

## 2020-10-31 DIAGNOSIS — Z23 Encounter for immunization: Secondary | ICD-10-CM | POA: Insufficient documentation

## 2020-10-31 DIAGNOSIS — N870 Mild cervical dysplasia: Secondary | ICD-10-CM | POA: Insufficient documentation

## 2021-08-10 DIAGNOSIS — Z20822 Contact with and (suspected) exposure to covid-19: Secondary | ICD-10-CM | POA: Diagnosis not present

## 2021-08-10 DIAGNOSIS — Z03818 Encounter for observation for suspected exposure to other biological agents ruled out: Secondary | ICD-10-CM | POA: Diagnosis not present

## 2021-08-22 ENCOUNTER — Ambulatory Visit
Admission: EM | Admit: 2021-08-22 | Discharge: 2021-08-22 | Disposition: A | Discharge status: Home / Self Care | Payer: BC Managed Care – PPO | Attending: Internal Medicine | Admitting: Internal Medicine

## 2021-08-22 ENCOUNTER — Other Ambulatory Visit: Payer: Self-pay

## 2021-08-22 DIAGNOSIS — N3001 Acute cystitis with hematuria: Secondary | ICD-10-CM | POA: Insufficient documentation

## 2021-08-22 LAB — URINALYSIS, COMPLETE (UACMP) WITH MICROSCOPIC
Bilirubin Urine: NEGATIVE
Glucose, UA: 100 mg/dL — AB
Ketones, ur: NEGATIVE mg/dL
Nitrite: POSITIVE — AB
Protein, ur: 100 mg/dL — AB
RBC / HPF: >50 RBC/hpf (ref 0–5)
Specific Gravity, Urine: 1.025 (ref 1.005–1.030)
WBC, UA: >50 WBC/hpf (ref 0–5)
pH: 6 (ref 5.0–8.0)

## 2021-08-22 MED ORDER — CEPHALEXIN 500 MG PO CAPS: 500.0000 mg | ORAL_CAPSULE | Freq: Two times a day (BID) | ORAL | 0 refills | Status: AC

## 2021-08-22 NOTE — ED Provider Notes (Signed)
[facility]    CSN: [id number] Arrival date & time: 08/22/21  1253      History   Chief Complaint Chief Complaint  Patient presents with   Dysuria    HPI Kim Gilbert is a 30 y.o. female comes to the urgent care with 1 day history of dysuria, frequency of micturition and urgency.  Patient denies any fever or chills.  No nausea or vomiting.  No flank pain.  Patient noticed some blood in the urine this morning.  No vaginal discharge.   HPI  History reviewed. No pertinent past medical history.  There are no problems to display for this patient.   Past Surgical History:  Procedure Laterality Date   NO PAST SURGERIES      OB History     Gravida  0   Para  0   Term  0   Preterm  0   AB  0   Living  0      SAB  0   IAB  0   Ectopic  0   Multiple  0   Live Births  0            Home Medications    Prior to Admission medications   Medication Sig Start Date End Date Taking? Authorizing Provider  cephALEXin (KEFLEX) 500 MG capsule Take 1 capsule (500 mg total) by mouth 2 (two) times daily for 5 days. 08/22/21 08/27/21 Yes Shahid Flori, Britta Mccreedy, MD    Family History History reviewed. No pertinent family history.  Social History Social History   Tobacco Use   Smoking status: Never   Smokeless tobacco: Never  Vaping Use   Vaping Use: Never used  Substance Use Topics   Alcohol use: Yes    Comment: occasionally   Drug use: No     Allergies   Patient has no known allergies.   Review of Systems Review of Systems  Constitutional: Negative.   Genitourinary:  Positive for dysuria, frequency and urgency.  Musculoskeletal: Negative.     Physical Exam Triage Vital Signs ED Triage Vitals  Enc Vitals Group     BP 08/22/21 1307 114/89     Pulse Rate 08/22/21 1307 71     Resp 08/22/21 1307 18     Temp 08/22/21 1307 98.4 F (36.9 C)     Temp Source 08/22/21 1307 Oral     SpO2 08/22/21 1307 100 %     Weight 08/22/21 1305 147  lb (66.7 kg)     Height 08/22/21 1305 5\' 3"  (1.6 m)     Head Circumference --      Peak Flow --      Pain Score 08/22/21 1304 5     Pain Loc --      Pain Edu? --      Excl. in GC? --    No data found.  Updated Vital Signs BP 114/89 (BP Location: Left Arm)    Pulse 71    Temp 98.4 F (36.9 C) (Oral)    Resp 18    Ht 5\' 3"  (1.6 m)    Wt 66.7 kg    LMP  (LMP Unknown)    SpO2 100%    BMI 26.04 kg/m   Visual Acuity Right Eye Distance:   Left Eye Distance:   Bilateral Distance:    Right Eye Near:   Left Eye Near:    Bilateral Near:     Physical Exam Vitals and nursing note reviewed.  Constitutional:      General: She is not in acute distress.    Appearance: She is not ill-appearing.  Cardiovascular:     Rate and Rhythm: Normal rate and regular rhythm.  Abdominal:     General: Bowel sounds are normal.     Palpations: Abdomen is soft.  Neurological:     Mental Status: She is alert.     UC Treatments / Results  Labs (all labs ordered are listed, but only abnormal results are displayed) Labs Reviewed  URINALYSIS, COMPLETE (UACMP) WITH MICROSCOPIC - Abnormal; Notable for the following components:      Result Value   APPearance CLOUDY (*)    Glucose, UA 100 (*)    Hgb urine dipstick LARGE (*)    Protein, ur 100 (*)    Nitrite POSITIVE (*)    Leukocytes,Ua MODERATE (*)    Bacteria, UA MANY (*)    All other components within normal limits  URINE CULTURE    EKG   Radiology No results found.  Procedures Procedures (including critical care time)  Medications Ordered in UC Medications - No data to display  Initial Impression / Assessment and Plan / UC Course  I have reviewed the triage vital signs and the nursing notes.  Pertinent labs & imaging results that were available during my care of the patient were reviewed by me and considered in my medical decision making (see chart for details).     1.  Acute cystitis with hematuria: Point-of-care urinalysis is  positive for hemoglobin, leukocyte Estrace, nitrites and many bacteria with WBC greater than 50 Urine cultures have been sent Keflex 500 mg twice daily for 5 days Increase oral fluid intake We will call patient with recommendations if indicated. Final Clinical Impressions(s) / UC Diagnoses   Final diagnoses:  Acute cystitis with hematuria     Discharge Instructions      Increase fluid intake Take medications as prescribed We will call you with recommendations if labs are abnormal.   ED Prescriptions     Medication Sig Dispense Auth. Provider   cephALEXin (KEFLEX) 500 MG capsule Take 1 capsule (500 mg total) by mouth 2 (two) times daily for 5 days. 10 capsule Treesa Mccully, Myrene Galas, MD      PDMP not reviewed this encounter.   Chase Picket, MD 08/22/21 [phone]

## 2021-08-22 NOTE — Discharge Instructions (Addendum)
Increase fluid intake Take medications as prescribed We will call you with recommendations if labs are abnormal.

## 2021-08-22 NOTE — ED Triage Notes (Signed)
Pt here with C/o urination pain and frequency since this morning.

## 2021-08-25 LAB — URINE CULTURE: Culture: >=100000 — AB

## 2021-09-20 ENCOUNTER — Ambulatory Visit
Admission: EM | Admit: 2021-09-20 | Discharge: 2021-09-20 | Disposition: A | Discharge status: Home / Self Care | Payer: BLUE CROSS/BLUE SHIELD | Attending: Emergency Medicine | Admitting: Emergency Medicine

## 2021-09-20 ENCOUNTER — Other Ambulatory Visit: Payer: Self-pay

## 2021-09-20 DIAGNOSIS — R1013 Epigastric pain: Secondary | ICD-10-CM | POA: Diagnosis not present

## 2021-09-20 DIAGNOSIS — F411 Generalized anxiety disorder: Secondary | ICD-10-CM | POA: Diagnosis not present

## 2021-09-20 MED ORDER — HYDROXYZINE HCL 25 MG PO TABS: 25.0000 mg | ORAL_TABLET | Freq: Four times a day (QID) | ORAL | 0 refills | Status: DC

## 2021-09-20 NOTE — ED Triage Notes (Signed)
Patient is here for "trouble breathing". Burping all day today too. Having really bad anxiety. Feels like I am having to concentrate on breathing, doesn't feel natural. No cold symptoms.

## 2021-09-20 NOTE — ED Provider Notes (Signed)
[facility]    CSN: [id number] Arrival date & time: 09/20/21  1918      History   Chief Complaint Chief Complaint  Patient presents with   Breathing Concerns   Burping    HPI Kim Gilbert is a 31 y.o. female.   HPI  31 year old female here for evaluation of anxiety symptoms.  Patient reports that she has been experiencing some pain in the middle of her chest and burping all day today.  This has triggered some anxiety symptoms, panic attack, and shortness of breath.  The dyspepsia actually started last night after eating a chicken gyro.  She reports that this morning when she woke up she had a sour taste in her mouth.  She denies any burning in her throat, nausea, sweats, radiation to arm or jaw, or cough.  She does have a history of anxiety and panic attacks in the past.  She has never been on medication for these and she is not currently seeing a therapist.  She indicates that she is never seen a therapist for this in the past.  She tries to deal with her anxiety symptoms by exercising.  History reviewed. No pertinent past medical history.  There are no problems to display for this patient.   Past Surgical History:  Procedure Laterality Date   NO PAST SURGERIES      OB History     Gravida  0   Para  0   Term  0   Preterm  0   AB  0   Living  0      SAB  0   IAB  0   Ectopic  0   Multiple  0   Live Births  0            Home Medications    Prior to Admission medications   Medication Sig Start Date End Date Taking? Authorizing Provider  hydrOXYzine (ATARAX) 25 MG tablet Take 1 tablet (25 mg total) by mouth every 6 (six) hours. 09/20/21  Yes Becky Augusta, NP    Family History History reviewed. No pertinent family history.  Social History Social History   Tobacco Use   Smoking status: Never   Smokeless tobacco: Never  Vaping Use   Vaping Use: Never used  Substance Use Topics   Alcohol use: Yes    Comment: occasionally    Drug use: No     Allergies   Patient has no known allergies.   Review of Systems Review of Systems  Constitutional:  Negative for fever.  HENT:         Sour taste in the mouth.  Respiratory:  Positive for shortness of breath. Negative for cough.   Cardiovascular:  Positive for chest pain.  Psychiatric/Behavioral:  The patient is nervous/anxious.     Physical Exam Triage Vital Signs ED Triage Vitals  Enc Vitals Group     BP 09/20/21 1932 130/79     Pulse Rate 09/20/21 1932 74     Resp 09/20/21 1932 16     Temp 09/20/21 1932 98.8 F (37.1 C)     Temp Source 09/20/21 1932 Oral     SpO2 09/20/21 1932 100 %     Weight 09/20/21 1932 148 lb (67.1 kg)     Height 09/20/21 1932 5\' 3"  (1.6 m)     Head Circumference --      Peak Flow --      Pain Score 09/20/21 1954 0  Pain Loc --      Pain Edu? --      Excl. in [place]? --    No data found.  Updated Vital Signs BP 130/79 (BP Location: Left Arm)    Pulse 74    Temp 98.8 F (37.1 C) (Oral)    Resp 16    Ht 5\' 3"  (1.6 m)    Wt 148 lb (67.1 kg)    LMP 09/17/2021 (Exact Date)    SpO2 100%    BMI 26.22 kg/m   Visual Acuity Right Eye Distance:   Left Eye Distance:   Bilateral Distance:    Right Eye Near:   Left Eye Near:    Bilateral Near:     Physical Exam Vitals and nursing note reviewed.  Constitutional:      General: She is not in acute distress.    Appearance: Normal appearance. She is not ill-appearing.  HENT:     Head: Normocephalic and atraumatic.  Cardiovascular:     Rate and Rhythm: Normal rate and regular rhythm.     Pulses: Normal pulses.     Heart sounds: Normal heart sounds. No murmur heard.   No friction rub. No gallop.  Pulmonary:     Effort: Pulmonary effort is normal.     Breath sounds: Normal breath sounds. No wheezing, rhonchi or rales.  Skin:    General: Skin is warm and dry.     Capillary Refill: Capillary refill takes less than 2 seconds.     Findings: No erythema or rash.  Neurological:      General: No focal deficit present.     Mental Status: She is alert and oriented to person, place, and time.  Psychiatric:        Mood and Affect: Mood normal.        Behavior: Behavior normal.        Thought Content: Thought content normal.        Judgment: Judgment normal.     UC Treatments / Results  Labs (all labs ordered are listed, but only abnormal results are displayed) Labs Reviewed - No data to display  EKG   Radiology No results found.  Procedures Procedures (including critical care time)  Medications Ordered in UC Medications - No data to display  Initial Impression / Assessment and Plan / UC Course  I have reviewed the triage vital signs and the nursing notes.  Pertinent labs & imaging results that were available during my care of the patient were reviewed by me and considered in my medical decision making (see chart for details).  Patient is a very pleasant, nontoxic-appearing 31 year old female here for evaluation of central chest pain and shortness of breath that is associated with frequent burping and a sour taste in her mouth as well.  Symptoms began late last night and are still present this morning.  She states that she developed the pain in middle of her chest first and also some burping which she believes triggered an anxiety attack.  She then had a sour taste in her mouth this morning when she woke up.  She denies any burning in her throat, nausea, sweats, arm or jaw pain, or cough.  As outlined in HPI above patient has a history of anxiety and panic attacks.  On physical exam patient has a benign cardiopulmonary exam with S1-S2 heart sounds free of murmur, rub, or gallop.  Lung sounds are clear to auscultation all fields.  There is no erythema or injection noted  in the posterior oropharynx.  Patient does have some reproducible pain along the distal third of her sternum but nothing in the lateral chest wall.  I suspect that patient is experiencing some  dyspepsia which triggered an anxiety state and borderline panic attack.  I have suggested to her that she should probably seek out and establish a relationship with a counselor to help her better manage her anxiety symptoms.  I will give her a prescription for hydroxyzine that she can use as needed for anxiety or as an abortive for panic attack.  I believe the dyspepsia was secondary to eating spicy food and I have advised her to avoid this for the time being and follow a bland diet.  Also take over-the-counter Pepcid 20 mg twice daily to help cut down acid production in the stomach that may be exacerbating her symptoms.  I also encouraged her to return for continued or worsening symptoms as needed.   Final Clinical Impressions(s) / UC Diagnoses   Final diagnoses:  Anxiety state  Dyspepsia     Discharge Instructions      I would recommend reaching out to Dr. Georgann Housekeeper to establish a councilor to help you with your anxiety. Her phone number is  [phone]. You may also want to try accessing your EAC program at work.  You can take hydroxyzine as needed for anxiety and to help abort a panic attack.  For your dyspepsia and burping avoid spicy or rich foods.  Take OTC Pepcid 20 mg twice daily to cut down on the acid production and help resolve your symptoms.  Return for re-evaluation for new or continued symptoms.     ED Prescriptions     Medication Sig Dispense Auth. Provider   hydrOXYzine (ATARAX) 25 MG tablet Take 1 tablet (25 mg total) by mouth every 6 (six) hours. 30 tablet Margarette Canada, NP      PDMP not reviewed this encounter.   Margarette Canada, NP 09/20/21 2000

## 2021-09-20 NOTE — Discharge Instructions (Signed)
I would recommend reaching out to Dr. Clementeen Graham to establish a councilor to help you with your anxiety. Her phone number is  [phone]. You may also want to try accessing your EAC program at work.  You can take hydroxyzine as needed for anxiety and to help abort a panic attack.  For your dyspepsia and burping avoid spicy or rich foods.  Take OTC Pepcid 20 mg twice daily to cut down on the acid production and help resolve your symptoms.  Return for re-evaluation for new or continued symptoms.

## 2021-10-16 ENCOUNTER — Other Ambulatory Visit: Payer: Self-pay

## 2021-10-16 ENCOUNTER — Encounter (HOSPITAL_BASED_OUTPATIENT_CLINIC_OR_DEPARTMENT_OTHER): Payer: Self-pay

## 2021-10-16 ENCOUNTER — Emergency Department (HOSPITAL_BASED_OUTPATIENT_CLINIC_OR_DEPARTMENT_OTHER)
Admission: EM | Admit: 2021-10-16 | Discharge: 2021-10-16 | Disposition: A | Discharge status: Home / Self Care | Payer: BLUE CROSS/BLUE SHIELD | Attending: Emergency Medicine | Admitting: Emergency Medicine

## 2021-10-16 DIAGNOSIS — S0990XA Unspecified injury of head, initial encounter: Secondary | ICD-10-CM | POA: Diagnosis not present

## 2021-10-16 DIAGNOSIS — Y92002 Bathroom of unspecified non-institutional (private) residence single-family (private) house as the place of occurrence of the external cause: Secondary | ICD-10-CM | POA: Diagnosis not present

## 2021-10-16 DIAGNOSIS — W182XXA Fall in (into) shower or empty bathtub, initial encounter: Secondary | ICD-10-CM | POA: Diagnosis not present

## 2021-10-16 DIAGNOSIS — S060X0A Concussion without loss of consciousness, initial encounter: Secondary | ICD-10-CM | POA: Diagnosis not present

## 2021-10-16 NOTE — Discharge Instructions (Addendum)
Most likely of a concussion.  You can take Tylenol and ibuprofen as needed for pain.  Please follow-up with your family doctor in the office.

## 2021-10-16 NOTE — ED Triage Notes (Signed)
Pt states she slipped/fell getting out of the shower 2/9-struck back of head on the floor-no break in skin/no LOC-c/o HA, falling asleep, "hard to register what people are saying"-NAD-steady gait

## 2021-10-16 NOTE — ED Provider Notes (Signed)
[place] EMERGENCY DEPARTMENT Provider Note   CSN: [id number] Arrival date & time: 10/16/21  1512     History  Chief Complaint  Patient presents with   Head Injury    Kim Gilbert is a 31 y.o. female.  31 yo F with a chief complaints of headaches and difficulty with hearing.  This is about 5 days after having a closed head injury.  The patient had gotten out of the shower and slipped on the ground and struck the back of her head.  She has been able to go about her normal life but has had some worsening headaches and mostly difficulty hearing while at work.  She denies one-sided numbness weakness denies difficulty speech or swallowing.  No history of anticoagulant use.  Not on aspirin.   Head Injury     Home Medications Prior to Admission medications   Medication Sig Start Date End Date Taking? Authorizing Provider  hydrOXYzine (ATARAX) 25 MG tablet Take 1 tablet (25 mg total) by mouth every 6 (six) hours. 09/20/21   Margarette Canada, NP      Allergies    Patient has no known allergies.    Review of Systems   Review of Systems  Physical Exam Updated Vital Signs BP 121/78 (BP Location: Right Arm)    Pulse 80    Temp 98 F (36.7 C) (Oral)    Resp 18    Ht 5\' 3"  (1.6 m)    Wt 68.5 kg    LMP 10/08/2021 (Exact Date)    SpO2 100%    BMI 26.75 kg/m  Physical Exam Vitals and nursing note reviewed.  Constitutional:      General: She is not in acute distress.    Appearance: She is well-developed. She is not diaphoretic.  HENT:     Head: Normocephalic and atraumatic.  Eyes:     Pupils: Pupils are equal, round, and reactive to light.  Cardiovascular:     Rate and Rhythm: Normal rate and regular rhythm.     Heart sounds: No murmur heard.   No friction rub. No gallop.  Pulmonary:     Effort: Pulmonary effort is normal.     Breath sounds: No wheezing or rales.  Abdominal:     General: There is no distension.     Palpations: Abdomen is soft.     Tenderness:  There is no abdominal tenderness.  Musculoskeletal:        General: No tenderness.     Cervical back: Normal range of motion and neck supple.  Skin:    General: Skin is warm and dry.  Neurological:     Mental Status: She is alert and oriented to person, place, and time.     Cranial Nerves: Cranial nerves 2-12 are intact.     Sensory: Sensation is intact.     Motor: Motor function is intact.     Coordination: Coordination is intact.     Comments: Benign neuro exam  Psychiatric:        Behavior: Behavior normal.    ED Results / Procedures / Treatments   Labs (all labs ordered are listed, but only abnormal results are displayed) Labs Reviewed - No data to display  EKG None  Radiology No results found.  Procedures Procedures    Medications Ordered in ED Medications - No data to display  ED Course/ Medical Decision Making/ A&P  Medical Decision Making  Patient is a 31 y.o. female with a cc of headache and some difficulty hearing.  Going on for about 5 days after a closed head injury.  She is well-appearing nontoxic has benign neurologic exam.  I discussed risk and benefits of CT imaging with the patient shared decision making bedside will declined to do head CT at this time.  We will treat as a concussion.  Have her follow-up with her family doctor.  On my record review it and see obvious family doctor listed for her.  We will give her resources to call the office upstairs and information for the concussion clinic if needed.  4:03 PM:  I have discussed the diagnosis/risks/treatment options with the patient.  Evaluation and diagnostic testing in the emergency department does not suggest an emergent condition requiring admission or immediate intervention beyond what has been performed at this time.  They will follow up with  PCP, concussion clinic. We also discussed returning to the ED immediately if new or worsening sx occur. We discussed the sx which are  most concerning (e.g., sudden worsening pain, fever, inability to tolerate by mouth, stroke s/sx) that necessitate immediate return. Medications administered to the patient during their visit and any new prescriptions provided to the patient are listed below.  Medications given during this visit Medications - No data to display   The patient appears reasonably screen and/or stabilized for discharge and I doubt any other medical condition or other [facility] requiring further screening, evaluation, or treatment in the ED at this time prior to discharge.           Final Clinical Impression(s) / ED Diagnoses Final diagnoses:  Concussion without loss of consciousness, initial encounter    Rx / DC Orders ED Discharge Orders     None         Deno Etienne, DO 10/16/21 1603

## 2021-10-19 DIAGNOSIS — B9689 Other specified bacterial agents as the cause of diseases classified elsewhere: Secondary | ICD-10-CM | POA: Diagnosis not present

## 2021-10-19 DIAGNOSIS — N926 Irregular menstruation, unspecified: Secondary | ICD-10-CM | POA: Diagnosis not present

## 2021-10-19 DIAGNOSIS — N898 Other specified noninflammatory disorders of vagina: Secondary | ICD-10-CM | POA: Diagnosis not present

## 2021-10-19 DIAGNOSIS — N760 Acute vaginitis: Secondary | ICD-10-CM | POA: Diagnosis not present

## 2022-07-31 ENCOUNTER — Ambulatory Visit (INDEPENDENT_AMBULATORY_CARE_PROVIDER_SITE_OTHER): Payer: BC Managed Care – PPO | Admitting: Obstetrics & Gynecology

## 2022-07-31 ENCOUNTER — Encounter: Payer: Self-pay | Admitting: Obstetrics & Gynecology

## 2022-07-31 ENCOUNTER — Other Ambulatory Visit (HOSPITAL_COMMUNITY)
Admission: RE | Admit: 2022-07-31 | Discharge: 2022-07-31 | Disposition: A | Discharge status: Home / Self Care | Payer: BC Managed Care – PPO | Source: Ambulatory Visit | Attending: Obstetrics & Gynecology | Admitting: Obstetrics & Gynecology

## 2022-07-31 VITALS — BP 117/78 | HR 84 | Resp 16 | Ht 62.0 in | Wt 146.0 lb

## 2022-07-31 DIAGNOSIS — N870 Mild cervical dysplasia: Secondary | ICD-10-CM

## 2022-07-31 DIAGNOSIS — Z01419 Encounter for gynecological examination (general) (routine) without abnormal findings: Secondary | ICD-10-CM | POA: Insufficient documentation

## 2022-07-31 DIAGNOSIS — E2839 Other primary ovarian failure: Secondary | ICD-10-CM

## 2022-07-31 NOTE — Progress Notes (Signed)
GYNECOLOGY ANNUAL PREVENTATIVE CARE ENCOUNTER NOTE  History:     Kim Gilbert is a 31 y.o. G0 female with history of premature ovarian failure diagnosed and previously followed at [facility], here for a routine annual gynecologic exam.  Current complaints: none.   Denies abnormal vaginal bleeding, discharge, pelvic pain, problems with intercourse or other gynecologic concerns.    Gynecologic History No LMP recorded. (Menstrual status: Other). Contraception: none Last Pap: 2022 at [facility]. Result was abnormal with ASCUS and positive HPV. Colposcopy showed CIN1.  Obstetric History OB History  Gravida Para Term Preterm AB Living  0 0 0 0 0 0  SAB IAB Ectopic Multiple Live Births  0 0 0 0 0    Past Medical History:  Diagnosis Date   Dysplasia of cervix, low grade (CIN 1)    Premature ovarian failure     Past Surgical History:  Procedure Laterality Date   NO PAST SURGERIES      No current outpatient medications on file prior to visit.   No current facility-administered medications on file prior to visit.    No Known Allergies  Social History:  reports that she has never smoked. She has never used smokeless tobacco. She reports current alcohol use. She reports that she does not use drugs.  Family History  Problem Relation Age of Onset   Liver cancer Maternal Grandfather     The following portions of the patient's history were reviewed and updated as appropriate: allergies, current medications, past family history, past medical history, past social history, past surgical history and problem list.  Review of Systems Pertinent items noted in HPI and remainder of comprehensive ROS otherwise negative.  Physical Exam:  BP 117/78   Pulse 84   Resp 16   Ht 5\' 2"  (1.575 m)   Wt 146 lb (66.2 kg)   BMI 26.70 kg/m  CONSTITUTIONAL: Well-developed, well-nourished female in no acute distress.  HENT:  Normocephalic, atraumatic, External right and left ear normal.  EYES:  Conjunctivae and EOM are normal. Pupils are equal, round, and reactive to light. No scleral icterus.  NECK: Normal range of motion, supple, no masses.  Normal thyroid.  SKIN: Skin is warm and dry. No rash noted. Not diaphoretic. No erythema. No pallor. MUSCULOSKELETAL: Normal range of motion. No tenderness.  No cyanosis, clubbing, or edema. NEUROLOGIC: Alert and oriented to person, place, and time. Normal reflexes, muscle tone coordination.  PSYCHIATRIC: Normal mood and affect. Normal behavior. Normal judgment and thought content. CARDIOVASCULAR: Normal heart rate noted, regular rhythm RESPIRATORY: Clear to auscultation bilaterally. Effort and breath sounds normal, no problems with respiration noted. BREASTS: Symmetric in size. No masses, tenderness, skin changes, nipple drainage, or lymphadenopathy bilaterally. Performed in the presence of a chaperone. ABDOMEN: Soft, no distention noted.  No tenderness, rebound or guarding.  PELVIC: Normal appearing external genitalia and urethral meatus; normal appearing vaginal mucosa and cervix.  No abnormal vaginal discharge noted.  Pap smear obtained.  Normal uterine size, no other palpable masses, no uterine or adnexal tenderness.  Performed in the presence of a chaperone.   Assessment and Plan:     1. Dysplasia of cervix, low grade (CIN 1) 2. Well woman exam with routine gynecological exam - Cytology - PAP( [facility]) Will follow up results of pap smear and manage accordingly. Routine preventative health maintenance measures emphasized. Please refer to After Visit Summary for other counseling recommendations.      , MD, FACOG Obstetrician & Gynecologist, Faculty  [facility] for [employer], [place]

## 2022-08-06 LAB — CYTOLOGY - PAP
Comment: NEGATIVE
Diagnosis: UNDETERMINED — AB
High risk HPV: POSITIVE — AB

## 2022-08-07 ENCOUNTER — Telehealth: Payer: Self-pay | Admitting: *Deleted

## 2022-08-07 ENCOUNTER — Encounter: Payer: Self-pay | Admitting: Obstetrics & Gynecology

## 2022-08-07 DIAGNOSIS — R87610 Atypical squamous cells of undetermined significance on cytologic smear of cervix (ASC-US): Secondary | ICD-10-CM | POA: Insufficient documentation

## 2022-08-07 NOTE — Telephone Encounter (Signed)
Patient wants to talk with Dr. Macon Large first before scheduling [facility]. Patient advised to call back to schedule.

## 2022-08-13 ENCOUNTER — Ambulatory Visit: Payer: BC Managed Care – PPO | Admitting: Family Medicine

## 2022-09-09 ENCOUNTER — Other Ambulatory Visit: Payer: Self-pay

## 2022-09-09 ENCOUNTER — Encounter (HOSPITAL_BASED_OUTPATIENT_CLINIC_OR_DEPARTMENT_OTHER): Payer: Self-pay | Admitting: Emergency Medicine

## 2022-09-09 ENCOUNTER — Emergency Department (HOSPITAL_BASED_OUTPATIENT_CLINIC_OR_DEPARTMENT_OTHER)
Admission: EM | Admit: 2022-09-09 | Discharge: 2022-09-09 | Disposition: A | Discharge status: Home / Self Care | Payer: BC Managed Care – PPO | Attending: Emergency Medicine | Admitting: Emergency Medicine

## 2022-09-09 DIAGNOSIS — R29810 Facial weakness: Secondary | ICD-10-CM | POA: Diagnosis not present

## 2022-09-09 DIAGNOSIS — H02401 Unspecified ptosis of right eyelid: Secondary | ICD-10-CM

## 2022-09-09 NOTE — ED Provider Notes (Signed)
[place] EMERGENCY DEPARTMENT Provider Note   CSN: [id number] Arrival date & time: 09/09/22  1425     History  Chief Complaint  Patient presents with   Eye Problem    right    Kim Gilbert is a 32 y.o. female with no significant past medical history presents the emergency department complaining of intermittent right eyelid drooping for the past 2 days.  Patient states that this happens about 4-5 times each day.  Last for several minutes at a time, and resolves on its own.  No associated vision changes.  No recent illness or injury.  She was doing some googling and has concerns that it could be Bell's palsy.  Patient is not having symptoms on my evaluation, she does have a picture on her phone of what this looks like just prior to me coming into the room.   Eye Problem      Home Medications Prior to Admission medications   Not on File      Allergies    Patient has no known allergies.    Review of Systems   Review of Systems  Eyes:        Eyelid droop  All other systems reviewed and are negative.   Physical Exam Updated Vital Signs BP (!) 103/90 (BP Location: Right Arm)   Pulse 66   Temp 98 F (36.7 C) (Oral)   Resp 16   Ht 5\' 3"  (1.6 m)   Wt 69.4 kg   SpO2 98%   BMI 27.10 kg/m  Physical Exam Vitals and nursing note reviewed.  Constitutional:      Appearance: Normal appearance.  HENT:     Head: Normocephalic and atraumatic.  Eyes:     General: Lids are normal.     Extraocular Movements: Extraocular movements intact.     Conjunctiva/sclera: Conjunctivae normal.     Pupils: Pupils are equal, round, and reactive to light.  Pulmonary:     Effort: Pulmonary effort is normal. No respiratory distress.  Skin:    General: Skin is warm and dry.  Neurological:     Mental Status: She is alert.  Psychiatric:        Mood and Affect: Mood normal.        Behavior: Behavior normal.     ED Results / Procedures / Treatments   Labs (all labs  ordered are listed, but only abnormal results are displayed) Labs Reviewed - No data to display  EKG None  Radiology No results found.  Procedures Procedures    Medications Ordered in ED Medications - No data to display  ED Course/ Medical Decision Making/ A&P Clinical Course as of 09/09/22 1829  Mon Sep 09, 2022  1818 Right eyelid drooping intermittently.  Currently asymptomatic.  Plan for OP referral. [CC]    Clinical Course User Index [CC] Tretha Sciara, MD                           Medical Decision Making This patient is a 32 y.o. female who presents to the ED for concern of right eyelid drooping.   Differential diagnoses prior to evaluation: Bell's palsy, CVA, orbital cellulitis, conjunctivitis  Past Medical History / Social History / Additional history: Chart reviewed. Pertinent results include: no significant PMH  Physical Exam: Physical exam performed. The pertinent findings include: Asymptomatic at this time. Picture on patient's phone shows mild right eyelid droop. No eyebrow involvement. Normal conjunctiva. PERRLA.  Disposition: After consideration of the diagnostic results and the patients response to treatment, I feel that emergency department workup does not suggest an emergent condition requiring admission or immediate intervention beyond what has been performed at this time. The plan is: discharge to home with follow up with PCP vs ophthalmologist. Low concern for emergent etiology at this time as patient is otherwise feeling well, eye is not infected, and no risk factors for CVA. The patient is safe for discharge and has been instructed to return immediately for worsening symptoms, change in symptoms or any other concerns.  Final Clinical Impression(s) / ED Diagnoses Final diagnoses:  Drooping eyelid, right    Rx / DC Orders ED Discharge Orders     None      Portions of this report may have been transcribed using voice recognition software.  Every effort was made to ensure accuracy; however, inadvertent computerized transcription errors may be present.    Jeanella Flattery 09/09/22 Scherrie Merritts, MD 09/10/22 [phone]

## 2022-09-09 NOTE — Discharge Instructions (Addendum)
You were seen in the emergency department for eyelid drooping.  As we discussed, I have low suspicion that there is an emergent reason behind your symptoms today. Your eye does not appear infected.   I would recommend following up with your primary doctor, especially if your symptoms persist. I've attached the contact information for a neurologist if you would like to follow up with them. Contact your eye doctor if you have any vision changes.   Continue to monitor how you're doing and return to the ER for new or worsening symptoms.

## 2022-09-09 NOTE — ED Triage Notes (Signed)
Right eyelid swelling, started yesterday , denies drainage or pain . No further symptoms .

## 2022-09-26 DIAGNOSIS — H02431 Paralytic ptosis of right eyelid: Secondary | ICD-10-CM | POA: Diagnosis not present

## 2022-10-15 ENCOUNTER — Encounter: Payer: Self-pay | Admitting: Family

## 2022-10-15 ENCOUNTER — Ambulatory Visit (INDEPENDENT_AMBULATORY_CARE_PROVIDER_SITE_OTHER): Payer: BC Managed Care – PPO | Admitting: Family

## 2022-10-15 VITALS — BP 111/77 | HR 79 | Temp 98.3°F | Resp 16 | Ht 62.01 in | Wt 148.0 lb

## 2022-10-15 DIAGNOSIS — R102 Pelvic and perineal pain: Secondary | ICD-10-CM | POA: Insufficient documentation

## 2022-10-15 DIAGNOSIS — R519 Headache, unspecified: Secondary | ICD-10-CM

## 2022-10-15 DIAGNOSIS — N83209 Unspecified ovarian cyst, unspecified side: Secondary | ICD-10-CM | POA: Insufficient documentation

## 2022-10-15 DIAGNOSIS — N920 Excessive and frequent menstruation with regular cycle: Secondary | ICD-10-CM | POA: Insufficient documentation

## 2022-10-15 DIAGNOSIS — R1900 Intra-abdominal and pelvic swelling, mass and lump, unspecified site: Secondary | ICD-10-CM | POA: Insufficient documentation

## 2022-10-15 MED ORDER — IBUPROFEN 600 MG PO TABS: 600.0000 mg | ORAL_TABLET | Freq: Three times a day (TID) | ORAL | 1 refills | Status: DC | PRN

## 2022-10-15 NOTE — Progress Notes (Signed)
Patient ID: Kim Gilbert, female    DOB: 08-13-1991  MRN: [id number]  CC: Headache   Subjective: Kim Gilbert is a 32 y.o. female who presents for headache.  Her concerns today include:  Patient reports headache began 2 days ago. Reports headache located bilaterally and radiates to the neck. She has dizziness usually upon awakening and resolves quickly. She denies red flag symptoms such as but not limited to worst headache of life, nausea/vomiting, chest pain, shortness of breath, change in vision/hearing. Reports she has not tried any over-the-counter medications to help with headache. Reports she does have history of migraines usually once monthly. Reports she could increase water intake and try to get more adequate rest. She denies any contributing symptoms to headaches such as stress. No further issues/concerns today.      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   No current facility-administered medications on file prior to visit.    No Known Allergies  Social History   Socioeconomic History   Marital status: Single    Spouse name: Not on file   Number of children: Not on file   Years of education: Not on file   Highest education level: Not on file  Occupational History   Not on file  Tobacco Use   Smoking status: Never    Passive exposure: Never    Smokeless tobacco: Never  Vaping Use   Vaping Use: Never used  Substance and Sexual Activity   Alcohol use: Yes    Comment: occasionally   Drug use: No   Sexual activity: Yes    Partners: Male    Birth control/protection: None  Other Topics Concern   Not on file  Social History Narrative   Not on file   Social Determinants of Health   Financial Resource Strain: Not on file  Food Insecurity: Not on file  Transportation Needs: Not on file  Physical Activity: Not on file  Stress: Not on file  Social Connections: Not on file  Intimate Partner Violence: Not on file    Family History  Problem Relation Age of Onset   Liver cancer Maternal Grandfather     Past Surgical History:  Procedure Laterality Date   NO PAST SURGERIES      ROS: Review of Systems Negative except as stated above  PHYSICAL EXAM: BP 111/77 (BP Location: Left Arm, Patient Position: Sitting, Cuff Size: Normal)   Pulse 79   Temp 98.3 F (36.8 C)   Resp 16   Ht 5' 2.01" (1.575 m)   Wt 148 lb (67.1 kg)   SpO2 98%   BMI 27.06 kg/m   Physical Exam HENT:     Head: Normocephalic and atraumatic.     Right Ear: Tympanic membrane, ear canal and external ear normal.  Left Ear: Tympanic membrane, ear canal and external ear normal.     Nose: Nose normal.     Mouth/Throat:     Mouth: Mucous membranes are moist.     Pharynx: Oropharynx is clear.  Eyes:     Extraocular Movements: Extraocular movements intact.     Pupils: Pupils are equal, round, and reactive to light.     Comments: Eyes symmetrical.  Cardiovascular:     Rate and Rhythm: Normal rate and regular rhythm.     Heart sounds: Normal heart sounds.  Pulmonary:     Effort: Pulmonary effort is normal.     Breath sounds: Normal breath sounds.  Musculoskeletal:     Right shoulder: Normal.     Left shoulder: Normal.     Right upper arm: Normal.     Left upper arm: Normal.     Right elbow: Normal.     Left elbow: Normal.     Right forearm:  Normal.     Left forearm: Normal.     Right wrist: Normal.     Left wrist: Normal.     Right hand: Normal.     Left hand: Normal.     Cervical back: Normal range of motion and neck supple.  Neurological:     General: No focal deficit present.     Mental Status: She is alert and oriented to person, place, and time.     Comments: Smile and frown even. Tongue midline.   Psychiatric:        Mood and Affect: Mood normal.        Behavior: Behavior normal.    ASSESSMENT AND PLAN: 1. Nonintractable headache, unspecified chronicity pattern, unspecified headache type - Ibuprofen as prescribed. Counseled on medication adherence/adverse effects.  - Routine screening.  - Follow-up with primary provider as scheduled.  - ibuprofen (ADVIL) 600 MG tablet; Take 1 tablet (600 mg total) by mouth every 8 (eight) hours as needed.  Dispense: 30 tablet; Refill: 1 - CBC - TSH - CMP14+EGFR - Hemoglobin A1c   Patient was given the opportunity to ask questions.  Patient verbalized understanding of the plan and was able to repeat key elements of the plan. Patient was given clear instructions to go to Emergency Department or return to medical center if symptoms don't improve, worsen, or new problems develop.The patient verbalized understanding.   Orders Placed This Encounter  Procedures   CBC   TSH   CMP14+EGFR   Hemoglobin A1c    Requested Prescriptions   Signed Prescriptions Disp Refills   ibuprofen (ADVIL) 600 MG tablet 30 tablet 1    Sig: Take 1 tablet (600 mg total) by mouth every 8 (eight) hours as needed.    Return in about 1 week (around 10/22/2022) for Follow-Up or next available Dorna Mai, MD .  Kim Herter, NP

## 2022-10-15 NOTE — Progress Notes (Signed)
Pt presents for head pressure and pain -pt states feels like something is squeezing her neck and making head swell -feels like sharp pains are running through my head  -symptoms dizziness, constant headache

## 2022-10-16 ENCOUNTER — Encounter: Payer: Self-pay | Admitting: Family

## 2022-10-16 DIAGNOSIS — R7303 Prediabetes: Secondary | ICD-10-CM | POA: Insufficient documentation

## 2022-10-16 LAB — CBC
Hematocrit: 40.2 % (ref 34.0–46.6)
Hemoglobin: 13.4 g/dL (ref 11.1–15.9)
MCH: 28.8 pg (ref 26.6–33.0)
MCHC: 33.3 g/dL (ref 31.5–35.7)
MCV: 86 fL (ref 79–97)
Platelets: 335 10*3/uL (ref 150–450)
RBC: 4.66 x10E6/uL (ref 3.77–5.28)
RDW: 13.2 % (ref 11.7–15.4)
WBC: 3.5 10*3/uL (ref 3.4–10.8)

## 2022-10-16 LAB — CMP14+EGFR
ALT: 19 IU/L (ref 0–32)
AST: 16 IU/L (ref 0–40)
Albumin/Globulin Ratio: 1.9 (ref 1.2–2.2)
Albumin: 4.8 g/dL (ref 4.0–5.0)
Alkaline Phosphatase: 77 IU/L (ref 44–121)
BUN/Creatinine Ratio: 17 (ref 9–23)
BUN: 11 mg/dL (ref 6–20)
Bilirubin Total: 0.3 mg/dL (ref 0.0–1.2)
CO2: 22 mmol/L (ref 20–29)
Calcium: 9.9 mg/dL (ref 8.7–10.2)
Chloride: 99 mmol/L (ref 96–106)
Creatinine, Ser: 0.63 mg/dL (ref 0.57–1.00)
Globulin, Total: 2.5 g/dL (ref 1.5–4.5)
Glucose: 85 mg/dL (ref 70–99)
Potassium: 4.4 mmol/L (ref 3.5–5.2)
Sodium: 136 mmol/L (ref 134–144)
Total Protein: 7.3 g/dL (ref 6.0–8.5)
eGFR: 123 mL/min/{1.73_m2} (ref >59)

## 2022-10-16 LAB — HEMOGLOBIN A1C
Est. average glucose Bld gHb Est-mCnc: 117 mg/dL
Hgb A1c MFr Bld: 5.7 % — ABNORMAL HIGH (ref 4.8–5.6)

## 2022-10-16 LAB — TSH: TSH: 1.36 u[IU]/mL (ref 0.450–4.500)

## 2022-10-31 ENCOUNTER — Ambulatory Visit: Payer: BC Managed Care – PPO | Admitting: Family Medicine

## 2023-05-02 ENCOUNTER — Telehealth: Payer: BC Managed Care – PPO | Admitting: Family Medicine

## 2023-05-02 DIAGNOSIS — B370 Candidal stomatitis: Secondary | ICD-10-CM | POA: Diagnosis not present

## 2023-05-02 MED ORDER — NYSTATIN 100000 UNIT/ML MT SUSP: 5.0000 mL | Freq: Four times a day (QID) | OROMUCOSAL | 0 refills | Status: DC

## 2023-05-02 NOTE — Patient Instructions (Signed)

## 2023-05-02 NOTE — Progress Notes (Signed)
Virtual Visit Consent   Kim Gilbert, you are scheduled for a virtual visit with a [facility] provider today. Just as with appointments in the office, your consent must be obtained to participate. Your consent will be active for this visit and any virtual visit you may have with one of our providers in the next 365 days. If you have a MyChart account, a copy of this consent can be sent to you electronically.  As this is a virtual visit, video technology does not allow for your provider to perform a traditional examination. This may limit your provider's ability to fully assess your condition. If your provider identifies any concerns that need to be evaluated in person or the need to arrange testing (such as labs, EKG, etc.), we will make arrangements to do so. Although advances in technology are sophisticated, we cannot ensure that it will always work on either your end or our end. If the connection with a video visit is poor, the visit may have to be switched to a telephone visit. With either a video or telephone visit, we are not always able to ensure that we have a secure connection.  By engaging in this virtual visit, you consent to the provision of healthcare and authorize for your insurance to be billed (if applicable) for the services provided during this visit. Depending on your insurance coverage, you may receive a charge related to this service.  I need to obtain your verbal consent now. Are you willing to proceed with your visit today? [name] has provided verbal consent on 05/02/2023 for a virtual visit (video or telephone). Georgana Curio, FNP  Date: 05/02/2023 2:03 PM  Virtual Visit via Video Note   I, Georgana Curio, connected with  Kim Gilbert  ([id number], 03/23/1991) on 05/02/23 at  2:00 PM EDT by a video-enabled telemedicine application and verified that I am speaking with the correct person using two identifiers.  Location: Patient: Virtual Visit Location Patient:  Home Provider: Virtual Visit Location Provider: Home Office   I discussed the limitations of evaluation and management by telemedicine and the availability of in person appointments. The patient expressed understanding and agreed to proceed.    History of Present Illness: Kim Gilbert is a 32 y.o. who identifies as a female who was assigned female at birth, and is being seen today for white patches on tongue. She is not diabetic and has not been on antibiotics. She has had a sore throat. No fever. Sx for a month now white patches appeared and she is worried about thrush. .  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  Resting comfortably  at home.  Head is normocephalic, atraumatic.  No labored breathing.  Speech is clear and coherent with logical content.  Patient is alert and oriented at baseline.  White appearance to tongue  Assessment and Plan: 1. Thrush  Warm salt water rinses, UC if sx persist or worsen.   Follow Up Instructions: I discussed the assessment and treatment plan with the patient. The patient was provided an opportunity to ask questions and all were answered. The patient agreed with the plan  and demonstrated an understanding of the instructions.  A copy of instructions were sent to the patient via MyChart unless otherwise noted below.     The patient was advised to call back or seek an in-person evaluation if the symptoms worsen or if the condition fails to improve as anticipated.  Time:  I spent 10 minutes with the patient via telehealth technology discussing the above problems/concerns.    Georgana Curio, FNP

## 2023-08-21 ENCOUNTER — Ambulatory Visit (INDEPENDENT_AMBULATORY_CARE_PROVIDER_SITE_OTHER): Payer: BC Managed Care – PPO | Admitting: Obstetrics and Gynecology

## 2023-08-21 ENCOUNTER — Encounter: Payer: Self-pay | Admitting: Obstetrics and Gynecology

## 2023-08-21 ENCOUNTER — Other Ambulatory Visit (HOSPITAL_COMMUNITY)
Admission: RE | Admit: 2023-08-21 | Discharge: 2023-08-21 | Disposition: A | Discharge status: Home / Self Care | Payer: BC Managed Care – PPO | Source: Ambulatory Visit | Attending: Obstetrics and Gynecology | Admitting: Obstetrics and Gynecology

## 2023-08-21 VITALS — BP 108/74 | HR 84 | Ht 62.0 in | Wt 148.0 lb

## 2023-08-21 DIAGNOSIS — Z01419 Encounter for gynecological examination (general) (routine) without abnormal findings: Secondary | ICD-10-CM

## 2023-08-21 NOTE — Progress Notes (Signed)
Subjective:     Kim Gilbert is a 32 y.o. female P0 with premature ovarian failure (extensive work up with [facility]) and BMI 27 who is here for a comprehensive physical exam. The patient reports no problems. She is not sexually active. She is not taking COC or HRT. She denies any episode of vaginal bleeding. She reports some occasional vasomotor symptoms. She denies pelvic pain or abnormal discharge. She is not currently sexually active. She is without any other complaints.   Past Medical History:  Diagnosis Date   Dysplasia of cervix, low grade (CIN 1)    Premature ovarian failure    Past Surgical History:  Procedure Laterality Date   NO PAST SURGERIES     Family History  Problem Relation Age of Onset   Liver cancer Maternal Grandfather     Social History   Socioeconomic History   Marital status: Single    Spouse name: Not on file   Number of children: Not on file   Years of education: Not on file   Highest education level: Not on file  Occupational History   Not on file  Tobacco Use   Smoking status: Never    Passive exposure: Never   Smokeless tobacco: Never  Vaping Use   Vaping status: Never Used  Substance and Sexual Activity   Alcohol use: Yes    Comment: occasionally   Drug use: No   Sexual activity: Yes    Partners: Male    Birth control/protection: None  Other Topics Concern   Not on file  Social History Narrative   Not on file   Social Drivers of Health   Financial Resource Strain: Not on file  Food Insecurity: Not on file  Transportation Needs: Not on file  Physical Activity: Not on file  Stress: Not on file  Social Connections: Not on file  Intimate Partner Violence: Not on file   Health Maintenance  Topic Date Due   HIV Screening  Never done   Hepatitis C Screening  Never done   DTaP/Tdap/Td (1 - Tdap) Never done   INFLUENZA VACCINE  Never done   COVID-19 Vaccine (1 - 2024-25 season) Never done   Cervical Cancer Screening (HPV/Pap Cotest)   07/31/2025   HPV VACCINES  Aged Out       Review of Systems Pertinent items noted in HPI and remainder of comprehensive ROS otherwise negative.   Objective:  Blood pressure 108/74, pulse 84, height 5\' 2"  (1.575 m), weight 148 lb (67.1 kg)   GENERAL: Well-developed, well-nourished female in no acute distress.  HEENT: Normocephalic, atraumatic. Sclerae anicteric.  NECK: Supple. Normal thyroid.  LUNGS: Clear to auscultation bilaterally.  HEART: Regular rate and rhythm. BREASTS: Symmetric in size. No palpable masses or lymphadenopathy, skin changes, or nipple drainage. ABDOMEN: Soft, nontender, nondistended. No organomegaly. PELVIC: Normal external female genitalia. Vagina is pink and rugated.  Normal discharge. Normal appearing cervix. Uterus is normal in size. No adnexal mass or tenderness. Chaperone present during the pelvic exam EXTREMITIES: No cyanosis, clubbing, or edema, 2+ distal pulses.     Assessment:    Healthy female exam.      Plan:    Pap smear collected Patient will be contacted with abnormal result Discussed importance of calcium and vitamin D supplement along with weight bearing exercises for bone protection Patient is not interested in restarting COC or HRT at this time See After Visit Summary for Counseling Recommendations

## 2023-08-26 LAB — CYTOLOGY - PAP
Adequacy: ABSENT
Comment: NEGATIVE
Comment: NEGATIVE
Comment: NEGATIVE
Diagnosis: NEGATIVE
HPV 16: NEGATIVE
HPV 18 / 45: NEGATIVE
High risk HPV: POSITIVE — AB

## 2024-07-01 ENCOUNTER — Encounter: Payer: Self-pay | Admitting: Obstetrics and Gynecology

## 2024-07-01 ENCOUNTER — Telehealth: Payer: Self-pay | Admitting: *Deleted

## 2024-07-01 ENCOUNTER — Ambulatory Visit: Admitting: Obstetrics and Gynecology

## 2024-07-01 VITALS — BP 116/84 | HR 78 | Ht 63.0 in | Wt 148.0 lb

## 2024-07-01 DIAGNOSIS — N939 Abnormal uterine and vaginal bleeding, unspecified: Secondary | ICD-10-CM | POA: Diagnosis not present

## 2024-07-01 DIAGNOSIS — E2839 Other primary ovarian failure: Secondary | ICD-10-CM | POA: Diagnosis not present

## 2024-07-01 MED ORDER — NORETHINDRONE-ETH ESTRADIOL 1-35 MG-MCG PO TABS: 1.0000 | ORAL_TABLET | Freq: Every day | ORAL | 4 refills | Status: AC

## 2024-07-01 NOTE — Progress Notes (Signed)
   RETURN GYNECOLOGY VISIT  Subjective:  Kim Gilbert is a 33 y.o. G0P0000 with history of POI presenting for vaginal bleeding.   Diagnosis through [facility]. Not currently taking HRT. Was previously working with an dentist. Is taking perelel supplements  No period in 3 years in s/o POI. Last month she started bleeding like a full period x 7 days. No bleeding since. No new medications or symptoms.   Interested in building a family. They were previously TTC. She was working with an [employer] in [place] but didn't have a great experience. Is starting to feel more open to oocyte donation and IVF.   Due for colposcopy for abnormal pap x 2.  I personally reviewed the following: - REI note Dr. 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 Patient is in no acute distress.  Skin: Skin is warm and dry. No rash noted.   Cardiovascular: Normal heart rate noted  Respiratory: Normal respiratory effort, no problems with respiration noted   Assessment and Plan:  Kim Gilbert is a 33 y.o. with POI & unscheduled bleeding  Abnormal uterine bleeding (AUB) Possible rare episode of ovulation Obtain pelvic US  to assess EL and r/o potential structural causes Is due for colposcopy; will do thorough eval of cervix and add vaginitis panel with that exam -     US  PELVIC COMPLETE WITH TRANSVAGINAL; Future  Primary ovarian insufficiency Discussed role for hormone therapy in protection of brain, heart, and bone health as well as overall lifespan. She is open to restarting.  Discussed potential benefits of transdermal estradiol with micronized progesterone, but she would like to restart OCPs for now -     norethindrone-ethinyl estradiol 1/35 (ORTHO-NOVUM)  tablet; Take 1 tablet by mouth daily. Skip placebo pills and go right to the next pack of active pills to take continuously  Cervical dysplasia Return for colposcopy ASAP  Return for colposcopy .  Future Appointments  Date Time Provider Department Center  08/04/2024  9:50 AM Erik Kieth BROCKS, MD [facility]   Kieth BROCKS Erik, MD

## 2024-07-01 NOTE — Telephone Encounter (Signed)
 Returned call from 3:49 PM. Advised patient of plan of care per Dr. Erik.

## 2024-07-01 NOTE — Patient Instructions (Signed)
 [employer] - REI in this area

## 2024-07-06 ENCOUNTER — Ambulatory Visit

## 2024-07-06 DIAGNOSIS — N939 Abnormal uterine and vaginal bleeding, unspecified: Secondary | ICD-10-CM

## 2024-07-12 ENCOUNTER — Ambulatory Visit: Payer: Self-pay | Admitting: Obstetrics and Gynecology

## 2024-08-04 ENCOUNTER — Encounter: Admitting: Obstetrics and Gynecology

## 2024-09-21 ENCOUNTER — Telehealth: Payer: Self-pay

## 2024-09-21 NOTE — Telephone Encounter (Signed)
 RN received voicemail from patient on nurse line regarding being nervous for the colpo procedure tomorrow, had a traumatic experience in the past. RN spoke with patient and reported would reach out to provider for guidance and get back with her. Message sent to provider.  Silvano LELON Piano, RN

## 2024-09-22 ENCOUNTER — Other Ambulatory Visit (HOSPITAL_COMMUNITY): Admission: RE | Admit: 2024-09-22 | Source: Home / Self Care | Admitting: Obstetrics & Gynecology

## 2024-09-22 ENCOUNTER — Other Ambulatory Visit (HOSPITAL_COMMUNITY)
Admission: RE | Admit: 2024-09-22 | Discharge: 2024-09-22 | Disposition: A | Discharge status: Home / Self Care | Source: Ambulatory Visit | Attending: Obstetrics & Gynecology | Admitting: Obstetrics & Gynecology

## 2024-09-22 ENCOUNTER — Ambulatory Visit: Admitting: Obstetrics & Gynecology

## 2024-09-22 ENCOUNTER — Other Ambulatory Visit (HOSPITAL_COMMUNITY)
Admission: RE | Admit: 2024-09-22 | Discharge: 2024-09-22 | Disposition: A | Source: Ambulatory Visit | Attending: Obstetrics & Gynecology | Admitting: Obstetrics & Gynecology

## 2024-09-22 ENCOUNTER — Encounter: Payer: Self-pay | Admitting: Obstetrics & Gynecology

## 2024-09-22 VITALS — BP 96/60 | HR 72 | Ht 63.0 in | Wt 150.0 lb

## 2024-09-22 DIAGNOSIS — R87810 Cervical high risk human papillomavirus (HPV) DNA test positive: Secondary | ICD-10-CM | POA: Insufficient documentation

## 2024-09-22 DIAGNOSIS — Z3202 Encounter for pregnancy test, result negative: Secondary | ICD-10-CM

## 2024-09-22 DIAGNOSIS — R87610 Atypical squamous cells of undetermined significance on cytologic smear of cervix (ASC-US): Secondary | ICD-10-CM | POA: Insufficient documentation

## 2024-09-22 DIAGNOSIS — N870 Mild cervical dysplasia: Secondary | ICD-10-CM | POA: Insufficient documentation

## 2024-09-22 LAB — POCT URINE PREGNANCY: Preg Test, Ur: NEGATIVE

## 2024-09-22 NOTE — Progress Notes (Signed)
" ° ° °  GYNECOLOGY OFFICE COLPOSCOPY PROCEDURE NOTE  34 y.o. G0P0000 here for colposcopy for having multiple paps with +HRHPV, history as summarized by Dr. 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  Patient gave informed written consent, time out was performed.  Urine pregnancy test negative. Patient was placed in lithotomy position. Cervix viewed with speculum, pap smear was obtained.  Colposcopy done sfter application of acetic acid.   Colposcopy adequate? Yes Acetowhite lesions noted at 7 and 11 o'clock; corresponding biopsies obtained.  ECC specimen obtained. All specimens were labeled and sent to pathology.  Chaperone Jesusa Maiden, [place]) was present during entire procedure.  Patient was given post procedure instructions.  Will follow up pathology and manage accordingly; patient will be contacted with results and recommendations.  Routine preventative health maintenance measures emphasized.    GLORIS HUGGER, MD, FACOG Obstetrician & Gynecologist, [facility] for [employer], [facility]       "

## 2024-09-22 NOTE — Patient Instructions (Signed)
 COLPOSCOPY POST-PROCEDURE INSTRUCTIONS  You may take Ibuprofen, Aleve or Tylenol for cramping if needed.  If Monsel's solution was used, you will have a black discharge.  Light bleeding is normal.  If bleeding is heavier than your period, please call.  Put nothing in your vagina until the bleeding or discharge stops (usually 2 or 3 days).  We will call you within one week with biopsy results or discuss the results at your follow-up appointment if needed.

## 2024-09-24 ENCOUNTER — Ambulatory Visit: Payer: Self-pay | Admitting: Obstetrics & Gynecology

## 2024-09-24 ENCOUNTER — Telehealth: Payer: Self-pay

## 2024-09-24 DIAGNOSIS — R87610 Atypical squamous cells of undetermined significance on cytologic smear of cervix (ASC-US): Secondary | ICD-10-CM

## 2024-09-24 DIAGNOSIS — N870 Mild cervical dysplasia: Secondary | ICD-10-CM

## 2024-09-24 LAB — SURGICAL PATHOLOGY

## 2024-09-24 NOTE — Telephone Encounter (Signed)
 RN called patient per request of the provider to review results. Pt reported had seen in her mychart and did not have any further questions.  Silvano LELON Piano, RN

## 2024-09-27 ENCOUNTER — Encounter: Payer: Self-pay | Admitting: Family Medicine

## 2024-09-28 NOTE — Telephone Encounter (Signed)
 Copied from CRM #[id number]. Topic: Clinical - Request for Lab/Test Order >> Sep 27, 2024  2:00 PM Delon T wrote: Reason for CRM: Patient is requesting labs for Complete metabolic panel CBC With Differential Complete Thyroid  Panel (includes TSH, Free T4, Free T3, Reverse T3, Total T4, Total T3, Thyroid  Peroxidase Antibodies (TPOAB), Thyroglobulin Antibodies (TGAB) HbA1C Fasting Insulin Homocysteine Fibrinogen Apolipoprotein B Vitamin B12 Vitamin D Vitamin A Lipid Panel (Cholesterol, Triglycerides, HDL Cholesterol, LDL Cholesterol) Iron Panel (Iron, TIBC, Iron Sat, Ferritin) FSH Prolactin LH DHEAs SHBG Total Testosterone Free Testosterone Pregnenolone Progesterone Please call patient [phone]

## 2024-09-29 LAB — CYTOLOGY - PAP
Comment: NEGATIVE
Comment: NEGATIVE
Comment: NEGATIVE
Diagnosis: UNDETERMINED — AB
HPV 16: NEGATIVE
HPV 18 / 45: NEGATIVE
High risk HPV: POSITIVE — AB

## 2024-10-08 ENCOUNTER — Encounter: Payer: Self-pay | Admitting: Obstetrics and Gynecology
